# Patient Record
Sex: Male | Born: 1955
Health system: Southern US, Community
[De-identification: ages and names within clinical notes are randomized; demographics above are authoritative.]

## PROBLEM LIST (undated history)

## (undated) DIAGNOSIS — E119 Type 2 diabetes mellitus without complications: Secondary | ICD-10-CM

## (undated) DIAGNOSIS — F329 Major depressive disorder, single episode, unspecified: Secondary | ICD-10-CM

## (undated) DIAGNOSIS — B019 Varicella without complication: Secondary | ICD-10-CM

## (undated) DIAGNOSIS — I10 Essential (primary) hypertension: Secondary | ICD-10-CM

## (undated) DIAGNOSIS — F32A Depression, unspecified: Secondary | ICD-10-CM

## (undated) DIAGNOSIS — N390 Urinary tract infection, site not specified: Secondary | ICD-10-CM

## (undated) HISTORY — DX: Major depressive disorder, single episode, unspecified: F32.9

## (undated) HISTORY — DX: Varicella without complication: B01.9

## (undated) HISTORY — DX: Essential (primary) hypertension: I10

## (undated) HISTORY — DX: Depression, unspecified: F32.A

## (undated) HISTORY — DX: Type 2 diabetes mellitus without complications: E11.9

## (undated) HISTORY — DX: Urinary tract infection, site not specified: N39.0

---

## 1999-05-07 ENCOUNTER — Emergency Department (HOSPITAL_COMMUNITY): Admission: EM | Admit: 1999-05-07 | Discharge: 1999-05-07 | Payer: Self-pay

## 1999-05-08 ENCOUNTER — Encounter: Payer: Self-pay | Admitting: Ophthalmology

## 1999-05-08 ENCOUNTER — Ambulatory Visit (HOSPITAL_COMMUNITY): Admission: RE | Admit: 1999-05-08 | Discharge: 1999-05-08 | Payer: Self-pay | Admitting: Ophthalmology

## 2001-01-22 ENCOUNTER — Encounter: Payer: Self-pay | Admitting: Orthopedic Surgery

## 2001-01-22 ENCOUNTER — Ambulatory Visit (HOSPITAL_COMMUNITY): Admission: RE | Admit: 2001-01-22 | Discharge: 2001-01-22 | Payer: Self-pay | Admitting: Orthopedic Surgery

## 2003-08-15 ENCOUNTER — Emergency Department (HOSPITAL_COMMUNITY): Admission: EM | Admit: 2003-08-15 | Discharge: 2003-08-15 | Payer: Self-pay | Admitting: Emergency Medicine

## 2004-11-29 ENCOUNTER — Ambulatory Visit: Payer: Self-pay | Admitting: Internal Medicine

## 2005-01-10 ENCOUNTER — Encounter: Admission: RE | Admit: 2005-01-10 | Discharge: 2005-01-10 | Payer: Self-pay | Admitting: Orthopedic Surgery

## 2005-01-21 ENCOUNTER — Ambulatory Visit: Payer: Self-pay | Admitting: Internal Medicine

## 2005-01-31 ENCOUNTER — Ambulatory Visit: Payer: Self-pay | Admitting: Internal Medicine

## 2006-08-02 ENCOUNTER — Ambulatory Visit: Payer: Self-pay | Admitting: Cardiology

## 2006-08-16 ENCOUNTER — Ambulatory Visit: Payer: Self-pay

## 2006-08-17 ENCOUNTER — Ambulatory Visit: Payer: Self-pay

## 2006-08-23 ENCOUNTER — Ambulatory Visit: Payer: Self-pay

## 2010-08-17 NOTE — Assessment & Plan Note (Signed)
[facility]                            CARDIOLOGY OFFICE NOTE   NAME:Tyler Andrews, Tyler Andrews                    MRN:          [id number]  DATE:08/23/2006                            DOB:          1955-08-13    Tyler Andrews is a pleasant 55 year old gentleman who I recently saw on  August 02, 2006 with chest pain.  At that time, we scheduled him to have  a stress Myoview.  The patient exercised for 6 minutes and 45 seconds,  and there were no ST changes.  There was no chest pain.  His ejection  fraction was 68%.  There was diaphragmatic attenuation, but no ischemia  was noted.  He also had carotid Dopplers that showed no obstructive  disease.  Since I last saw him, he is doing reasonably well.  He did  state that he had mild chest tightness while [place] the beach, but  he has had no other symptoms.  There is no dyspnea on exertion,  orthopnea, PND, pedal edema, palpitations, presyncope, or syncope.  His  medications at present include multivitamin, Xanax 0.25 mg p.o. nightly,  CPAP, aspirin 81 mg p.o. daily, Metformin 1000 mg p.o. b.i.d., and Paxil  20 mg p.o. daily.   PHYSICAL EXAMINATION:  VITAL SIGNS:  His physical exam today shows a  blood pressure of 133/80, and his pulse is 97.  He weighs 296 pounds.  HEENT:  Normal.  NECK:  Supple.  CHEST:  Clear.  CARDIAC:  Regular rate and rhythm.  ABDOMEN:  Benign.  EXTREMITIES:  No edema.   DIAGNOSES:  1. Recent chest pain:  The patient's Myoview was low risk,      demonstrating diaphragmatic attenuation but no ischemia or      infarction.  He has normal left ventricular function.  He did have      a brief episode of chest pain while walking at the beach; however,      he has had none since then.  We have discussed the options today,      including medical therapy versus cardiac catheterization.  Given      that his Myoview is low risk, he has elected to proceed with      medical therapy, and I think this  is appropriate.  If his symptoms      worsen, then we can consider cardiac catheterization in the future,      otherwise we will plan to repeat his Myoviews in the future.  He      will continue with his aspirin, and I will add a statin as well as      an ACE inhibitor.  2. Diabetes mellitus:  He will continue on his present medications,      and this will be managed by Dr. Doristine Counter.  Again, we will add a      statin as well as an ACE inhibitor.  I will check a BMET in one      week to follow his potassium and renal function.  We will begin      lisinopril 10 mg p.o. daily.  3. Hypertension:  We have added an ACE inhibitor, both to his blood      pressure and his diabetes.  4. Obstructive sleep apnea.  5. History of anxiety/depression.  6. Risk factor modification:  We have added Zocor 40 mg p.o. nightly,      and we will check lipids      and liver in six weeks.  We will adjust with a goal LDL of less      than 70.  He will continue with diet and exercise.  He does not      smoke.  I will see him back in six months.     Madolyn Frieze Jens Som, MD, [facility]  Electronically Signed    BSC/MedQ  DD: 08/23/2006  DT: 08/23/2006  Job #: [id number]   cc:   Marjory Lies, M.D.

## 2010-08-17 NOTE — Assessment & Plan Note (Signed)
[facility]                            CARDIOLOGY OFFICE NOTE   NAME:Tyler Andrews, Tyler Andrews                    MRN:          [id number]  DATE:08/02/2006                            DOB:          08-29-1955    Tyler Andrews is a 55 year old male with a past medical history of  recently diagnosed diabetes mellitus, anxiety and depression, who  presents for evaluation of chest pain.  The patient was seen by Dr.  Gerri Spore in May of 2005 secondary to chest pain.  At that time, he  apparently was having significant social stressors.  He underwent a  Myoview on October 07, 2003.  His ejection fraction was 65%.  There was no  ischemia or infarction.  He has done well.  Over the past four months,  he has implemented an exercise program.  Approximately two weeks, he was  exercising on an elliptical trainer and after 40 minutes he developed a  pain in his left upper chest and shoulder.  It was described as an ache.  There was no nausea with this.  There was dyspnea and diaphoresis, but  the patient was exercising at the time.  After stopping, he had a  residual soreness through most of the day.  Several days later, he had a  similar sensation after 40 minutes on the treadmill.  He has limited his  activities since then and has had no further symptoms.  Note, he denies  any dyspnea on exertion, orthopnea, PND, pedal edema, palpitations,  presyncope, or syncope.  Also note, the pain was not pleuritic or  positional nor was it related to food.  Because of his chest pain, we  were asked to further evaluate.   MEDICATIONS:  1. A multivitamin daily.  2. Xanax 0.5 mg tablets, one-half p.o. q.h.s.  3. He is on CPAP.  4. Aspirin 81 mg daily.  5. Metformin 1000 mg p.o. daily.  6. Paxil 20 mg p.o. daily.   He has no known drug allergies.   SOCIAL HISTORY:  He does not smoke nor does he consume alcohol.   FAMILY HISTORY:  His father had coronary artery bypass and graft in  his  89s.   PAST MEDICAL HISTORY:  1. He has a six-month to one year history of diabetes mellitus.  2. There is no hypertension or hyperlipidemia by his report.  3. He does have obstructive sleep apnea.  4. He has had a prior kidney infection.  5. There is a history of anxiety and depression.  6. He has had no previous surgeries.   REVIEW OF SYSTEMS:  There are no headaches or fevers or chills.  There  is no productive cough or hemoptysis.  There is no dysphagia,  odynophagia, melena, or hematochezia.  There is no dysuria or hematuria.  There is no rash or seizure activity.  There is no orthopnea, PND, or  pedal edema.  The remaining systems are negative.   PHYSICAL EXAMINATION:  VITAL SIGNS:  The patient's blood pressure is  148/88 and his pulse is 96.  He weighs 292 pounds.  GENERAL:  He is  well-developed and obese.  He is in no acute distress.  His skin is warm and dry.  He does not appear to be depressed.  There is  no peripheral clubbing.  His back is normal.  HEENT:  Normal with normal eyelids.  NECK:  Supple.  There a soft right carotid bruit.  There is no left  carotid bruit.  There is no jugulovenous distention and no thyromegaly  noted.  CHEST:  Clear to auscultation with normal expansion.  CARDIOVASCULAR:  Regular rate and rhythm with normal S1 and S2.  There  are no murmurs, rubs, or gallops noted.  ABDOMEN:  Not tender, positive bowel sounds, no hepatosplenomegaly, and  no masses appreciated.  There is no abdominal bruit.  His femoral pulses  are difficult to palpate but appear to be 1+.  There is no bruit noted.  EXTREMITIES:  Mild varicosities, but there is no edema, and no cords are  palpated.  He has 2+ dorsalis pedis pulses bilaterally.  NEUROLOGIC:  Exam is grossly intact.   Electrocardiogram shows a sinus rhythm at a rate of 88.  The axis is  normal.  There are minor nonspecific ST changes.   DIAGNOSES:  1. Recent episode of chest pain - the etiology of Mr.  Sebo chest      pain is unclear.  Some of his symptoms are concerning in that they      occurred with exertion.  However, he had a residual soreness      afterwards.  He does have risk factors, and we will plan to risk      stratify with a stress Myoview.  If it shows normal perfusion then      we will continue with medical therapy.  I will continue with his      aspirin.  Note, we will follow him afterwards and if he has      recurrent symptoms with exercise then he would most likely require      a cardiac catheterization.  2. Diabetes mellitus - he will continue on his Metformin, and this      being managed by Dr. Doristine Counter.  He would benefit from a Statin and      possibly an ACE inhibitor long term, but I will leave this to Dr.      Mellody Life discretion.  3. Elevated blood pressure - this will need to be followed closely and      treated as indicated.  4. Obstructive sleep apnea.  5. History of anxiety and depression.   We will plan to see him back in two weeks to review the above-assessed  studies.  I have asked him to limit his activities until we get the  stress test performed.     Madolyn Frieze Jens Som, MD, [facility]  Electronically Signed   BSC/MedQ  DD: 08/02/2006  DT: 08/02/2006  Job #: [id number]   cc:   Marjory Lies, M.D.

## 2013-01-23 ENCOUNTER — Ambulatory Visit (INDEPENDENT_AMBULATORY_CARE_PROVIDER_SITE_OTHER): Payer: BC Managed Care – PPO | Admitting: Family Medicine

## 2013-01-23 ENCOUNTER — Encounter: Payer: Self-pay | Admitting: Family Medicine

## 2013-01-23 VITALS — BP 118/60 | HR 119 | Temp 98.4°F | Ht 65.0 in | Wt 297.9 lb

## 2013-01-23 DIAGNOSIS — E1165 Type 2 diabetes mellitus with hyperglycemia: Secondary | ICD-10-CM | POA: Insufficient documentation

## 2013-01-23 DIAGNOSIS — I10 Essential (primary) hypertension: Secondary | ICD-10-CM

## 2013-01-23 DIAGNOSIS — Z8659 Personal history of other mental and behavioral disorders: Secondary | ICD-10-CM

## 2013-01-23 DIAGNOSIS — E785 Hyperlipidemia, unspecified: Secondary | ICD-10-CM

## 2013-01-23 LAB — HM DIABETES EYE EXAM

## 2013-01-23 LAB — HM DIABETES FOOT EXAM: HM Diabetic Foot Exam: NORMAL

## 2013-01-23 NOTE — Progress Notes (Signed)
  Subjective:    Patient ID: Tyler Andrews, male    DOB: 06-Jan-1956, 57 y.o.   MRN: [id number]  HPI Patient seen to establish care. Has chronic problems including history of morbid obesity, type 2 diabetes, history of depression, hypertension, dyslipidemia. He has apparently not seen primary care and about one year. He recalls his last A1c was around 8% but this was probably over one year ago. Current medications are reviewed and as listed elsewhere. He has been very reluctant to take any kind injectable in the past. He currently takes Glucovance 5/500 mg 2 tablets twice daily. No recent hypoglycemia. Blood sugars are reviewed and consistently over 200 fasting. He has a couple postprandials also over 200.  He has eye exam scheduled for this Friday. He denies any complications of diabetes such as retinopathy, neuropathy, or nephropathy. Hyperlipidemia is treated with simvastatin. He is on lisinopril for hypertension. He takes Paxil for depression and those symptoms are stable. He has taken low dose alprazolam for sleep.  Patient nonsmoker. He works in [employer]. He is married. No alcohol use. No regular exercise. Family history significant for father with coronary disease age 4. His father had type 2 diabetes.  Past Medical History  Diagnosis Date  . Depression   . Diabetes mellitus without complication   . Hypertension   . Urinary tract infection   . Chicken pox    History reviewed. No pertinent past surgical history.  reports that he has never smoked. He does not have any smokeless tobacco history on file. He reports that he does not drink alcohol or use illicit drugs. family history includes Cancer in his maternal grandfather; Diabetes in his father; Heart disease (age of onset: 34) in his father. No Known Allergies    Review of Systems  Constitutional: Negative for appetite change, fatigue and unexpected weight change.  Respiratory: Negative for cough and shortness  of breath.   Cardiovascular: Negative for chest pain.  Gastrointestinal: Negative for nausea, vomiting and abdominal pain.  Endocrine: Negative for polydipsia and polyuria.  Genitourinary: Negative for dysuria.  Neurological: Negative for dizziness, syncope and headaches.       Objective:   Physical Exam  Constitutional:  Pleasant obese 57 year old male  HENT:  Mouth/Throat: Oropharynx is clear and moist.  Neck: Neck supple. No thyromegaly present.  Cardiovascular: Normal rate and regular rhythm.   Pulmonary/Chest: Effort normal and breath sounds normal. No respiratory distress. He has no wheezes. He has no rales.  Musculoskeletal: He exhibits no edema.  Skin:  Feet reveal no skin lesions. Good distal foot pulses. Good capillary refill. No calluses. Normal sensation with monofilament testing   Psychiatric: He has a normal mood and affect. His behavior is normal.          Assessment & Plan:  #1 type 2 diabetes. History of suboptimal control and especially by recent home readings. Schedule labs including A1c. We discussed possible referral for dietitian and he is undecided. We discussed weight loss at some length. Establish more consistent exercise. Eye exam scheduled for this Friday #2 hypertension. Blood pressure well controlled today #3 dyslipidemia on simvastatin. Schedule fasting labs #4 history of depression which is currently stable on Paxil

## 2013-01-23 NOTE — Patient Instructions (Signed)
Type 2 Diabetes Mellitus, Adult Type 2 diabetes mellitus, often simply referred to as type 2 diabetes, is a long-lasting (chronic) disease. In type 2 diabetes, the pancreas does not make enough insulin (a hormone), the cells are less responsive to the insulin that is made (insulin resistance), or both. Normally, insulin moves sugars from food into the tissue cells. The tissue cells use the sugars for energy. The lack of insulin or the lack of normal response to insulin causes excess sugars to build up in the blood instead of going into the tissue cells. As a result, high blood sugar (hyperglycemia) develops. The effect of high sugar (glucose) levels can cause many complications. Type 2 diabetes was also previously called adult-onset diabetes but it can occur at any age.  RISK FACTORS  A person is predisposed to developing type 2 diabetes if someone in the family has the disease and also has one or more of the following primary risk factors:  Overweight.  An inactive lifestyle.  A history of consistently eating high-calorie foods. Maintaining a normal weight and regular physical activity can reduce the chance of developing type 2 diabetes. SYMPTOMS  A person with type 2 diabetes may not show symptoms initially. The symptoms of type 2 diabetes appear slowly. The symptoms include:  Increased thirst (polydipsia).  Increased urination (polyuria).  Increased urination during the night (nocturia).  Weight loss. This weight loss may be rapid.  Frequent, recurring infections.  Tiredness (fatigue).  Weakness.  Vision changes, such as blurred vision.  Fruity smell to your breath.  Abdominal pain.  Nausea or vomiting.  Cuts or bruises which are slow to heal.  Tingling or numbness in the hands or feet. DIAGNOSIS Type 2 diabetes is frequently not diagnosed until complications of diabetes are present. Type 2 diabetes is diagnosed when symptoms or complications are present and when blood  glucose levels are increased. Your blood glucose level may be checked by one or more of the following blood tests:  A fasting blood glucose test. You will not be allowed to eat for at least 8 hours before a blood sample is taken.  A random blood glucose test. Your blood glucose is checked at any time of the day regardless of when you ate.  A hemoglobin A1c blood glucose test. A hemoglobin A1c test provides information about blood glucose control over the previous 3 months.  An oral glucose tolerance test (OGTT). Your blood glucose is measured after you have not eaten (fasted) for 2 hours and then after you drink a glucose-containing beverage. TREATMENT   You may need to take insulin or diabetes medicine daily to keep blood glucose levels in the desired range.  You will need to match insulin dosing with exercise and healthy food choices. The treatment goal is to maintain the before meal blood sugar (preprandial glucose) level at 70 130 mg/dL. HOME CARE INSTRUCTIONS   Have your hemoglobin A1c level checked twice a year.  Perform daily blood glucose monitoring as directed by your caregiver.  Monitor urine ketones when you are ill and as directed by your caregiver.  Take your diabetes medicine or insulin as directed by your caregiver to maintain your blood glucose levels in the desired range.  Never run out of diabetes medicine or insulin. It is needed every day.  Adjust insulin based on your intake of carbohydrates. Carbohydrates can raise blood glucose levels but need to be included in your diet. Carbohydrates provide vitamins, minerals, and fiber which are an essential part of   a healthy diet. Carbohydrates are found in fruits, vegetables, whole grains, dairy products, legumes, and foods containing added sugars.    Eat healthy foods. Alternate 3 meals with 3 snacks.  Lose weight if overweight.  Carry a medical alert card or wear your medical alert jewelry.  Carry a 15 gram  carbohydrate snack with you at all times to treat low blood glucose (hypoglycemia). Some examples of 15 gram carbohydrate snacks include:  Glucose tablets, 3 or 4   Glucose gel, 15 gram tube  Raisins, 2 tablespoons (24 grams)  Jelly beans, 6  Animal crackers, 8  Regular pop, 4 ounces (120 mL)  Gummy treats, 9  Recognize hypoglycemia. Hypoglycemia occurs with blood glucose levels of 70 mg/dL and below. The risk for hypoglycemia increases when fasting or skipping meals, during or after intense exercise, and during sleep. Hypoglycemia symptoms can include:  Tremors or shakes.  Decreased ability to concentrate.  Sweating.  Increased heart rate.  Headache.  Dry mouth.  Hunger.  Irritability.  Anxiety.  Restless sleep.  Altered speech or coordination.  Confusion.  Treat hypoglycemia promptly. If you are alert and able to safely swallow, follow the 15:15 rule:  Take 15 20 grams of rapid-acting glucose or carbohydrate. Rapid-acting options include glucose gel, glucose tablets, or 4 ounces (120 mL) of fruit juice, regular soda, or low fat milk.  Check your blood glucose level 15 minutes after taking the glucose.  Take 15 20 grams more of glucose if the repeat blood glucose level is still 70 mg/dL or below.  Eat a meal or snack within 1 hour once blood glucose levels return to normal.    Be alert to polyuria and polydipsia which are early signs of hyperglycemia. An early awareness of hyperglycemia allows for prompt treatment. Treat hyperglycemia as directed by your caregiver.  Engage in at least 150 minutes of moderate-intensity physical activity a week, spread over at least 3 days of the week or as directed by your caregiver. In addition, you should engage in resistance exercise at least 2 times a week or as directed by your caregiver.  Adjust your medicine and food intake as needed if you start a new exercise or sport.  Follow your sick day plan at any time you  are unable to eat or drink as usual.  Avoid tobacco use.  Limit alcohol intake to no more than 1 drink per day for nonpregnant women and 2 drinks per day for men. You should drink alcohol only when you are also eating food. Talk with your caregiver whether alcohol is safe for you. Tell your caregiver if you drink alcohol several times a week.  Follow up with your caregiver regularly.  Schedule an eye exam soon after the diagnosis of type 2 diabetes and then annually.  Perform daily skin and foot care. Examine your skin and feet daily for cuts, bruises, redness, nail problems, bleeding, blisters, or sores. A foot exam by a caregiver should be done annually.  Brush your teeth and gums at least twice a day and floss at least once a day. Follow up with your dentist regularly.  Share your diabetes management plan with your workplace or school.  Stay up-to-date with immunizations.  Learn to manage stress.  Obtain ongoing diabetes education and support as needed.  Participate in, or seek rehabilitation as needed to maintain or improve independence and quality of life. Request a physical or occupational therapy referral if you are having foot or hand numbness or difficulties with grooming,   dressing, eating, or physical activity. SEEK MEDICAL CARE IF:   You are unable to eat food or drink fluids for more than 6 hours.  You have nausea and vomiting for more than 6 hours.  Your blood glucose level is over 240 mg/dL.  There is a change in mental status.  You develop an additional serious illness.  You have diarrhea for more than 6 hours.  You have been sick or have had a fever for a couple of days and are not getting better.  You have pain during any physical activity.  SEEK IMMEDIATE MEDICAL CARE IF:  You have difficulty breathing.  You have moderate to large ketone levels. MAKE SURE YOU:  Understand these instructions.  Will watch your condition.  Will get help right away if  you are not doing well or get worse. Document Released: 03/21/2005 Document Revised: 12/14/2011 Document Reviewed: 10/18/2011 [employer] Patient Information 2014 [employer].  

## 2013-02-07 ENCOUNTER — Other Ambulatory Visit: Payer: BC Managed Care – PPO

## 2013-02-11 ENCOUNTER — Encounter: Payer: Self-pay | Admitting: Family Medicine

## 2013-02-11 ENCOUNTER — Other Ambulatory Visit: Payer: Self-pay

## 2013-02-11 ENCOUNTER — Ambulatory Visit (INDEPENDENT_AMBULATORY_CARE_PROVIDER_SITE_OTHER): Payer: BC Managed Care – PPO | Admitting: Family Medicine

## 2013-02-11 VITALS — BP 128/68 | HR 78 | Temp 97.8°F | Wt 299.0 lb

## 2013-02-11 DIAGNOSIS — I10 Essential (primary) hypertension: Secondary | ICD-10-CM

## 2013-02-11 DIAGNOSIS — E785 Hyperlipidemia, unspecified: Secondary | ICD-10-CM

## 2013-02-11 DIAGNOSIS — E119 Type 2 diabetes mellitus without complications: Secondary | ICD-10-CM

## 2013-02-11 DIAGNOSIS — E1165 Type 2 diabetes mellitus with hyperglycemia: Secondary | ICD-10-CM

## 2013-02-11 MED ORDER — ALPRAZOLAM 0.5 MG PO TABS: 0.5000 mg | ORAL_TABLET | Freq: Every evening | ORAL | Status: DC | PRN

## 2013-02-11 MED ORDER — GLYBURIDE-METFORMIN 5-500 MG PO TABS: 2.0000 | ORAL_TABLET | Freq: Two times a day (BID) | ORAL | Status: DC

## 2013-02-11 MED ORDER — SIMVASTATIN 40 MG PO TABS: 40.0000 mg | ORAL_TABLET | Freq: Every day | ORAL | Status: DC

## 2013-02-11 MED ORDER — PAROXETINE HCL 20 MG PO TABS: 20.0000 mg | ORAL_TABLET | ORAL | Status: DC

## 2013-02-11 MED ORDER — CANAGLIFLOZIN 100 MG PO TABS: 1.0000 | ORAL_TABLET | Freq: Every day | ORAL | Status: DC

## 2013-02-11 MED ORDER — LISINOPRIL 10 MG PO TABS: 10.0000 mg | ORAL_TABLET | Freq: Every day | ORAL | Status: DC

## 2013-02-11 NOTE — Progress Notes (Signed)
  Subjective:    Patient ID: Tyler Andrews, male    DOB: Jun 24, 1955, 57 y.o.   MRN: [id number]  HPI  Patient is seen for medical followup. History of morbid obesity, type 2 diabetes, hypertension, hyperlipidemia and history of depression. Patient had some recent lab work which is done through his employer. These labs are reviewed. HDL 44, triglycerides 147, total cholesterol 150, LDL 77. Hemoglobin A1c 9.1%.  Patient's had type 2 diabetes for at least 10 years. Currently takes Glucovance. Has not taken other medications. His A1c's have gradually climbed over the past year. Poor dietary compliance. He does get regular eye exams. No history of any retinopathy, neuropathy, or nephropathy  Hypertension treated with lisinopril and well controlled. No cough or other side effects.  Hyperlipidemia treated with simvastatin. Lipids fairly well controlled as above.  Past Medical History  Diagnosis Date  . Depression   . Diabetes mellitus without complication   . Hypertension   . Urinary tract infection   . Chicken pox    No past surgical history on file.  reports that he has never smoked. He does not have any smokeless tobacco history on file. He reports that he does not drink alcohol or use illicit drugs. family history includes Cancer in his maternal grandfather; Diabetes in his father; Heart disease (age of onset: 6) in his father. No Known Allergies   Review of Systems  Constitutional: Negative for fatigue and unexpected weight change.  Eyes: Negative for visual disturbance.  Respiratory: Negative for cough, chest tightness and shortness of breath.   Cardiovascular: Negative for chest pain, palpitations and leg swelling.  Endocrine: Negative for polydipsia and polyuria.  Genitourinary: Negative for dysuria.  Neurological: Negative for dizziness, syncope, weakness, light-headedness and headaches.       Objective:   Physical Exam  Constitutional: He appears well-developed and  well-nourished. No distress.  Neck: Neck supple.  Cardiovascular: Normal rate and regular rhythm.   Pulmonary/Chest: Effort normal and breath sounds normal. No respiratory distress. He has no wheezes. He has no rales.  Musculoskeletal: He exhibits no edema.          Assessment & Plan:  #1 type 2 diabetes. Poorly controlled. He is not interested and injectables at this time. We have talked about foundation for diabetes control being weight loss, exercise, and dietary control. Check basic metabolic panel. If GFR over 45 start Invokana 100 mg once daily. Reassess in 2 months. Samples given #2 hypertension well controlled. Continue current medication #3 hyperlipidemia with recent lipids as above. LDL at goal. #4 chronic insomnia. He is obstructive sleep apnea and for years has taken low-dose alprazolam. This is refilled for 6 months

## 2013-02-12 ENCOUNTER — Telehealth: Payer: Self-pay | Admitting: Family Medicine

## 2013-02-12 LAB — BASIC METABOLIC PANEL
BUN: 18 mg/dL (ref 6–23)
Calcium: 9.2 mg/dL (ref 8.4–10.5)
GFR: 94.36 mL/min (ref >60.00)
Glucose, Bld: 195 mg/dL — ABNORMAL HIGH (ref 70–99)
Potassium: 4.2 mEq/L (ref 3.5–5.1)
Sodium: 136 mEq/L (ref 135–145)

## 2013-02-12 MED ORDER — GLYBURIDE-METFORMIN 5-500 MG PO TABS: 2.0000 | ORAL_TABLET | Freq: Two times a day (BID) | ORAL | Status: DC

## 2013-02-12 NOTE — Telephone Encounter (Signed)
Pt was rx the wrong dose for his glyBURIDE-metformin (GLUCOVANCE) 5-500 MG per tablet. Pt take 2 tabs/ 2 x day. We sent in 180 tabs which is only 45 days worth. Need 90 day refill. [facility]

## 2013-02-12 NOTE — Telephone Encounter (Signed)
Re sent the 90 day supply

## 2013-02-13 ENCOUNTER — Other Ambulatory Visit: Payer: Self-pay

## 2013-02-13 MED ORDER — GLYBURIDE-METFORMIN 5-500 MG PO TABS: 2.0000 | ORAL_TABLET | Freq: Two times a day (BID) | ORAL | Status: DC

## 2013-02-19 ENCOUNTER — Telehealth: Payer: Self-pay

## 2013-02-19 MED ORDER — GLYBURIDE-METFORMIN 5-500 MG PO TABS: 2.0000 | ORAL_TABLET | Freq: Two times a day (BID) | ORAL | Status: DC

## 2013-02-19 NOTE — Telephone Encounter (Addendum)
Opened in error

## 2013-02-19 NOTE — Telephone Encounter (Signed)
This rx is still not correct:  pt needs 360 tabs total, which is 4/day (2 in am , 2 in pm) which is a 90 day supply. Can you pls [facility]. They are going to send the wrong thing pretty soon!  [facility] says we will need to call them in order to get this fixed.

## 2013-02-20 ENCOUNTER — Other Ambulatory Visit: Payer: Self-pay

## 2013-02-20 MED ORDER — GLYBURIDE-METFORMIN 5-500 MG PO TABS: 2.0000 | ORAL_TABLET | Freq: Two times a day (BID) | ORAL | Status: DC

## 2013-02-20 NOTE — Telephone Encounter (Signed)
Resent #[id number] with [facility] refills to [facility]

## 2013-03-25 ENCOUNTER — Telehealth: Payer: Self-pay | Admitting: Family Medicine

## 2013-03-25 NOTE — Telephone Encounter (Signed)
Patient Information:  Caller Name: Britta Mccreedy  Phone: [phone]  Patient: Tyler Andrews, Tyler Andrews  Gender: Male  DOB: Apr 28, 1955  Age: 57 Years  PCP: Evelena Peat [facility])  Office Follow Up:  Does the office need to follow up with this patient?: No  Instructions For The Office: N/A  RN Note:  Rn discussed care advice . NO appts available with Dr. Caryl Never / PCP  for 03/26/2013, and RN offered to schedule AM appt with Dr Cato Mulligan , but pt will be traveling back from [facility] and needs afternoon appt.  Appt scheduled with Dr Fabian Sharp for 4:00 PM for 03/26/2013  and advised to seek medical attention in [facility] if worsening symptoms.  Symptoms  Reason For Call & Symptoms: Today, 03/25/2013, Spouse calling  from out of town in [facility]  wondering  if pt has yeast infection and requesting rx.  His  head of penis is red, itchy and burning since  03/23/2013.  Some tiny bumps . He has been applying OTC  Antifungal cream  today  and is feeling  some better. Spouse questions if it is related to starting Innovkana in 02/2013  Reviewed Health History In EMR: Yes  Reviewed Medications In EMR: Yes  Reviewed Allergies In EMR: Yes  Reviewed Surgeries / Procedures: Yes  Date of Onset of Symptoms: 03/23/2013  Treatments Tried: Antifungal  Treatments Tried Worked: No  Guideline(s) Used:  Penis and Scrotum Symptoms  Disposition Per Guideline:   See Today or Tomorrow in Office  Reason For Disposition Reached:   ALL other penis - scrotum symptoms (Exception: painless rash < 24 hours duration)  Advice Given:  Genital Hygiene:  Keep your penis and scrotal area dry. Wear cotton underwear.  Call Back If:  Rash spreads or becomes worse  Rash lasts more than one day  Fever occurs  You become worse.  Patient Will Follow Care Advice:  YES  Appointment Scheduled:  03/26/2013 16:00:00 Appointment Scheduled Provider:  Berniece Andreas [facility])

## 2013-03-26 ENCOUNTER — Ambulatory Visit: Payer: Self-pay | Admitting: Internal Medicine

## 2013-05-23 ENCOUNTER — Telehealth: Payer: Self-pay | Admitting: Family Medicine

## 2013-05-23 NOTE — Telephone Encounter (Signed)
Pt states his Canagliflozin (INVOKANA) 100 MG TABS is not working for him and request to come in for a lab first, and then schedule appt  so that he can discuss labs and get on a new med. Ok to schedule lab first?

## 2013-05-23 NOTE — Telephone Encounter (Signed)
Pt is going to set up appt.

## 2013-05-24 ENCOUNTER — Ambulatory Visit (INDEPENDENT_AMBULATORY_CARE_PROVIDER_SITE_OTHER): Payer: BC Managed Care – PPO | Admitting: Family Medicine

## 2013-05-24 ENCOUNTER — Encounter: Payer: Self-pay | Admitting: Family Medicine

## 2013-05-24 VITALS — BP 128/64 | HR 96 | Wt 300.0 lb

## 2013-05-24 DIAGNOSIS — IMO0001 Reserved for inherently not codable concepts without codable children: Secondary | ICD-10-CM

## 2013-05-24 DIAGNOSIS — E1165 Type 2 diabetes mellitus with hyperglycemia: Secondary | ICD-10-CM

## 2013-05-24 DIAGNOSIS — I10 Essential (primary) hypertension: Secondary | ICD-10-CM

## 2013-05-24 DIAGNOSIS — E785 Hyperlipidemia, unspecified: Secondary | ICD-10-CM

## 2013-05-24 DIAGNOSIS — IMO0002 Reserved for concepts with insufficient information to code with codable children: Secondary | ICD-10-CM

## 2013-05-24 LAB — LDL CHOLESTEROL, DIRECT: Direct LDL: 74.4 mg/dL

## 2013-05-24 LAB — LIPID PANEL
CHOL/HDL RATIO: 4
Cholesterol: 137 mg/dL (ref 0–200)
HDL: 38.7 mg/dL — ABNORMAL LOW (ref >39.00)
Triglycerides: 251 mg/dL — ABNORMAL HIGH (ref 0.0–149.0)
VLDL: 50.2 mg/dL — ABNORMAL HIGH (ref 0.0–40.0)

## 2013-05-24 LAB — HEPATIC FUNCTION PANEL
ALK PHOS: 65 U/L (ref 39–117)
ALT: 27 U/L (ref 0–53)
AST: 16 U/L (ref 0–37)
Albumin: 3.7 g/dL (ref 3.5–5.2)
BILIRUBIN DIRECT: 0 mg/dL (ref 0.0–0.3)
Total Bilirubin: 0.4 mg/dL (ref 0.3–1.2)
Total Protein: 7.3 g/dL (ref 6.0–8.3)

## 2013-05-24 LAB — HEMOGLOBIN A1C: Hgb A1c MFr Bld: 9.2 % — ABNORMAL HIGH (ref 4.6–6.5)

## 2013-05-24 MED ORDER — FLUCONAZOLE 150 MG PO TABS: 150.0000 mg | ORAL_TABLET | Freq: Once | ORAL | Status: DC

## 2013-05-24 NOTE — Progress Notes (Signed)
   Subjective:    Patient ID: Tyler Andrews, male    DOB: 1955-10-09, 58 y.o.   MRN: [id number]  Diabetes Pertinent negatives for hypoglycemia include no dizziness or headaches. Pertinent negatives for diabetes include no chest pain, no fatigue, no polydipsia, no polyuria and no weakness.   Patient is seen for medical followup. History of obesity, type 2 diabetes, hypertension, hyperlipidemia, and history of recurrent depression. We started  Invokana and he developed what sounds like possible yeast infection. At this point he only takes glyburide/metformin combination. Recent fasting blood sugars around 200 and postprandial blood sugars around 300. He denies any symptoms of hyperglycemia. He has poor compliance with exercise and diet the plan is to make some changes soon. He takes simvastatin for hyperlipidemia. Lisinopril for hypertension. Blood pressure well controlled. He is getting regular eye exams. He's had some occasional fleeting neuropathy type symptoms in the feet   Past Medical History  Diagnosis Date  . Depression   . Diabetes mellitus without complication   . Hypertension   . Urinary tract infection   . Chicken pox    No past surgical history on file.  reports that he has never smoked. He does not have any smokeless tobacco history on file. He reports that he does not drink alcohol or use illicit drugs. family history includes Cancer in his maternal grandfather; Diabetes in his father; Heart disease (age of onset: 66) in his father. No Known Allergies    Review of Systems  Constitutional: Negative for fatigue.  Eyes: Negative for visual disturbance.  Respiratory: Negative for cough, chest tightness and shortness of breath.   Cardiovascular: Negative for chest pain, palpitations and leg swelling.  Endocrine: Negative for polydipsia and polyuria.  Neurological: Negative for dizziness, syncope, weakness, light-headedness and headaches.       Objective:   Physical Exam    Constitutional: He is oriented to person, place, and time. He appears well-developed and well-nourished.  HENT:  Right Ear: External ear normal.  Left Ear: External ear normal.  Mouth/Throat: Oropharynx is clear and moist.  Eyes: Pupils are equal, round, and reactive to light.  Neck: Neck supple. No thyromegaly present.  Cardiovascular: Normal rate and regular rhythm.   Pulmonary/Chest: Effort normal and breath sounds normal. No respiratory distress. He has no wheezes. He has no rales.  Musculoskeletal: He exhibits no edema.  Neurological: He is alert and oriented to person, place, and time.  Skin:  Feet no lesions. Normal distal pulses          Assessment & Plan:  #1 type 2 diabetes. History of suboptimal control. Possible side effects with Invokana . Discussed importance of weight loss. Recheck A1c. Will do one more trial with medication as above. If not tolerating at that time we discussed either addition of long-acting insulin versus possible Victoza. #2 hypertension well controlled. #3 dyslipidemia. Recheck lipid and hepatic panel

## 2013-05-24 NOTE — Progress Notes (Signed)
Pre visit review using our clinic review tool, if applicable. No additional management support is needed unless otherwise documented below in the visit note. 

## 2013-05-27 ENCOUNTER — Telehealth: Payer: Self-pay | Admitting: Family Medicine

## 2013-05-27 NOTE — Telephone Encounter (Signed)
Relevant patient education mailed to patient.  

## 2013-07-24 ENCOUNTER — Other Ambulatory Visit: Payer: Self-pay | Admitting: Family Medicine

## 2013-08-21 ENCOUNTER — Ambulatory Visit (INDEPENDENT_AMBULATORY_CARE_PROVIDER_SITE_OTHER): Payer: BC Managed Care – PPO | Admitting: Family Medicine

## 2013-08-21 ENCOUNTER — Encounter: Payer: Self-pay | Admitting: Family Medicine

## 2013-08-21 VITALS — BP 126/68 | HR 99 | Temp 98.4°F | Wt 300.0 lb

## 2013-08-21 DIAGNOSIS — I10 Essential (primary) hypertension: Secondary | ICD-10-CM

## 2013-08-21 DIAGNOSIS — E1165 Type 2 diabetes mellitus with hyperglycemia: Secondary | ICD-10-CM

## 2013-08-21 DIAGNOSIS — R109 Unspecified abdominal pain: Secondary | ICD-10-CM

## 2013-08-21 DIAGNOSIS — R10A1 Flank pain, right side: Secondary | ICD-10-CM

## 2013-08-21 DIAGNOSIS — IMO0001 Reserved for inherently not codable concepts without codable children: Secondary | ICD-10-CM

## 2013-08-21 DIAGNOSIS — IMO0002 Reserved for concepts with insufficient information to code with codable children: Secondary | ICD-10-CM

## 2013-08-21 DIAGNOSIS — F5104 Psychophysiologic insomnia: Secondary | ICD-10-CM

## 2013-08-21 DIAGNOSIS — E785 Hyperlipidemia, unspecified: Secondary | ICD-10-CM

## 2013-08-21 DIAGNOSIS — G4700 Insomnia, unspecified: Secondary | ICD-10-CM

## 2013-08-21 LAB — POCT URINALYSIS DIPSTICK
BILIRUBIN UA: NEGATIVE
Ketones, UA: NEGATIVE
Leukocytes, UA: NEGATIVE
Nitrite, UA: NEGATIVE
Protein, UA: NEGATIVE
RBC UA: NEGATIVE
Spec Grav, UA: 1.015
Urobilinogen, UA: 0.2
pH, UA: 5

## 2013-08-21 MED ORDER — PAROXETINE HCL 20 MG PO TABS: 20.0000 mg | ORAL_TABLET | ORAL | Status: DC

## 2013-08-21 MED ORDER — LISINOPRIL 10 MG PO TABS: 10.0000 mg | ORAL_TABLET | Freq: Every day | ORAL | Status: DC

## 2013-08-21 MED ORDER — CANAGLIFLOZIN 100 MG PO TABS: 100.0000 mg | ORAL_TABLET | Freq: Every day | ORAL | Status: DC

## 2013-08-21 MED ORDER — SIMVASTATIN 40 MG PO TABS: 40.0000 mg | ORAL_TABLET | Freq: Every day | ORAL | Status: DC

## 2013-08-21 MED ORDER — ALPRAZOLAM 0.5 MG PO TABS: 0.5000 mg | ORAL_TABLET | Freq: Every evening | ORAL | Status: DC | PRN

## 2013-08-21 MED ORDER — GLYBURIDE-METFORMIN 5-500 MG PO TABS: 2.0000 | ORAL_TABLET | Freq: Two times a day (BID) | ORAL | Status: DC

## 2013-08-21 NOTE — Progress Notes (Signed)
   Subjective:    Patient ID: Tyler Andrews, male    DOB: 1956-02-02, 58 y.o.   MRN: [id number]  HPI Followup regarding type 2 diabetes. History of poor control. Last A1c 9.2%. We added Invokana 100 mg once daily. No side effects. Blood sugars are still elevated off and on. He does not check his consistently. He has not lost any weight and has not improved his exercise as he planned to.  He has some occasional right flank pains. No hematuria. No burning with urination. No fevers or chills. No appetite or weight changes. No alleviating factors.  Worse with certain movements.  Requesting refill Xanax. Takes one at night and has taken this for several years. No history of misuse. No ETOH.  Hyperlipidemia. Lipids are checked last visit and stable  Past Medical History  Diagnosis Date  . Depression   . Diabetes mellitus without complication   . Hypertension   . Urinary tract infection   . Chicken pox    No past surgical history on file.  reports that he has never smoked. He does not have any smokeless tobacco history on file. He reports that he does not drink alcohol or use illicit drugs. family history includes Cancer in his maternal grandfather; Diabetes in his father; Heart disease (age of onset: 64) in his father. No Known Allergies    Review of Systems  Constitutional: Negative for fatigue.  Eyes: Negative for visual disturbance.  Respiratory: Negative for cough, chest tightness and shortness of breath.   Cardiovascular: Negative for chest pain, palpitations and leg swelling.  Endocrine: Negative for polydipsia and polyuria.  Genitourinary: Positive for flank pain. Negative for dysuria and hematuria.  Neurological: Negative for dizziness, syncope, weakness, light-headedness and headaches.       Objective:   Physical Exam  Constitutional: He appears well-developed and well-nourished.  Neck: Neck supple. No thyromegaly present.  Cardiovascular: Normal rate and regular  rhythm.   Pulmonary/Chest: Effort normal and breath sounds normal. No respiratory distress. He has no wheezes. He has no rales.  Musculoskeletal: He exhibits no edema.  Neurological: He is alert.          Assessment & Plan:  #1 type 2 diabetes. History of poor control. Lose some weight. Recheck A1c. Consider further titration of Invokana. #2 hypertension which is well-controlled #3 hyperlipidemia. Recent LDL at goal. He still has high triglycerides and low HDL #4 chronic insomnia. Refill alprazolam for 6 months #5 right flank pain. Intermittent for quite some time. Check urinalysis.

## 2013-08-21 NOTE — Progress Notes (Signed)
Pre visit review using our clinic review tool, if applicable. No additional management support is needed unless otherwise documented below in the visit note. 

## 2013-08-22 ENCOUNTER — Other Ambulatory Visit: Payer: Self-pay

## 2013-08-22 LAB — HEMOGLOBIN A1C: HEMOGLOBIN A1C: 8.5 % — AB (ref 4.6–6.5)

## 2013-08-22 MED ORDER — CANAGLIFLOZIN 300 MG PO TABS: 300.0000 mg | ORAL_TABLET | Freq: Every day | ORAL | Status: DC

## 2013-11-25 ENCOUNTER — Other Ambulatory Visit (INDEPENDENT_AMBULATORY_CARE_PROVIDER_SITE_OTHER): Payer: BC Managed Care – PPO

## 2013-11-25 DIAGNOSIS — Z0000 Encounter for general adult medical examination without abnormal findings: Secondary | ICD-10-CM

## 2013-11-25 LAB — BASIC METABOLIC PANEL
BUN: 23 mg/dL (ref 6–23)
CHLORIDE: 103 meq/L (ref 96–112)
CO2: 26 meq/L (ref 19–32)
Calcium: 9 mg/dL (ref 8.4–10.5)
Creatinine, Ser: 0.8 mg/dL (ref 0.4–1.5)
GFR: 103.55 mL/min (ref >60.00)
GLUCOSE: 173 mg/dL — AB (ref 70–99)
POTASSIUM: 4.5 meq/L (ref 3.5–5.1)
SODIUM: 139 meq/L (ref 135–145)

## 2013-11-25 LAB — CBC WITH DIFFERENTIAL/PLATELET
BASOS PCT: 0.5 % (ref 0.0–3.0)
Basophils Absolute: 0 10*3/uL (ref 0.0–0.1)
Eosinophils Absolute: 0.2 10*3/uL (ref 0.0–0.7)
Eosinophils Relative: 2.1 % (ref 0.0–5.0)
HCT: 42.4 % (ref 39.0–52.0)
Hemoglobin: 13.9 g/dL (ref 13.0–17.0)
Lymphocytes Relative: 28.5 % (ref 12.0–46.0)
Lymphs Abs: 2.3 10*3/uL (ref 0.7–4.0)
MCHC: 32.7 g/dL (ref 30.0–36.0)
MCV: 87.6 fl (ref 78.0–100.0)
MONOS PCT: 8.8 % (ref 3.0–12.0)
Monocytes Absolute: 0.7 10*3/uL (ref 0.1–1.0)
NEUTROS PCT: 60.1 % (ref 43.0–77.0)
Neutro Abs: 4.9 10*3/uL (ref 1.4–7.7)
PLATELETS: 300 10*3/uL (ref 150.0–400.0)
RBC: 4.84 Mil/uL (ref 4.22–5.81)
RDW: 14.6 % (ref 11.5–15.5)
WBC: 8.1 10*3/uL (ref 4.0–10.5)

## 2013-11-25 LAB — PSA: PSA: 0.44 ng/mL (ref 0.10–4.00)

## 2013-11-25 LAB — POCT URINALYSIS DIPSTICK
Bilirubin, UA: NEGATIVE
Blood, UA: NEGATIVE
Ketones, UA: NEGATIVE
LEUKOCYTES UA: NEGATIVE
NITRITE UA: NEGATIVE
PH UA: 5
PROTEIN UA: NEGATIVE
Spec Grav, UA: <=1.005
Urobilinogen, UA: 0.2

## 2013-11-25 LAB — HEPATIC FUNCTION PANEL
ALBUMIN: 3.5 g/dL (ref 3.5–5.2)
ALT: 20 U/L (ref 0–53)
AST: 14 U/L (ref 0–37)
Alkaline Phosphatase: 57 U/L (ref 39–117)
Bilirubin, Direct: 0.1 mg/dL (ref 0.0–0.3)
TOTAL PROTEIN: 6.7 g/dL (ref 6.0–8.3)
Total Bilirubin: 0.6 mg/dL (ref 0.2–1.2)

## 2013-11-25 LAB — MICROALBUMIN / CREATININE URINE RATIO
Creatinine,U: 46 mg/dL
Microalb Creat Ratio: 1.3 mg/g (ref 0.0–30.0)
Microalb, Ur: 0.6 mg/dL (ref 0.0–1.9)

## 2013-11-25 LAB — HEMOGLOBIN A1C: HEMOGLOBIN A1C: 8.8 % — AB (ref 4.6–6.5)

## 2013-11-25 LAB — LIPID PANEL
CHOL/HDL RATIO: 3
Cholesterol: 130 mg/dL (ref 0–200)
HDL: 39.9 mg/dL (ref >39.00)
LDL CALC: 59 mg/dL (ref 0–99)
NONHDL: 90.1
Triglycerides: 157 mg/dL — ABNORMAL HIGH (ref 0.0–149.0)
VLDL: 31.4 mg/dL (ref 0.0–40.0)

## 2013-11-25 LAB — TSH: TSH: 2.2 u[IU]/mL (ref 0.35–4.50)

## 2013-11-27 ENCOUNTER — Ambulatory Visit: Payer: BC Managed Care – PPO | Admitting: Family Medicine

## 2013-11-28 ENCOUNTER — Ambulatory Visit (INDEPENDENT_AMBULATORY_CARE_PROVIDER_SITE_OTHER): Payer: BC Managed Care – PPO | Admitting: Family Medicine

## 2013-11-28 ENCOUNTER — Encounter: Payer: Self-pay | Admitting: Family Medicine

## 2013-11-28 VITALS — BP 126/68 | HR 94 | Temp 97.6°F | Ht 65.0 in | Wt 300.0 lb

## 2013-11-28 DIAGNOSIS — IMO0001 Reserved for inherently not codable concepts without codable children: Secondary | ICD-10-CM

## 2013-11-28 DIAGNOSIS — Z23 Encounter for immunization: Secondary | ICD-10-CM

## 2013-11-28 DIAGNOSIS — E1165 Type 2 diabetes mellitus with hyperglycemia: Secondary | ICD-10-CM

## 2013-11-28 DIAGNOSIS — Z0000 Encounter for general adult medical examination without abnormal findings: Secondary | ICD-10-CM

## 2013-11-28 DIAGNOSIS — IMO0002 Reserved for concepts with insufficient information to code with codable children: Secondary | ICD-10-CM

## 2013-11-28 MED ORDER — ALPRAZOLAM 0.5 MG PO TABS: 0.5000 mg | ORAL_TABLET | Freq: Every evening | ORAL | Status: DC | PRN

## 2013-11-28 NOTE — Progress Notes (Signed)
   Subjective:    Patient ID: Tyler Andrews, male    DOB: Dec 11, 1955, 58 y.o.   MRN: [id number]  HPI Here for complete physical. He has history of obesity, type 2 diabetes, hypertension, dyslipidemia. Chronic insomnia. Medications reviewed. Blood sugars remain poorly controlled. Fasting blood sugars mostly around 160. Recent A1c 8.8%. No success with weight loss. He is already taking combination of two oral medications. Blood pressures have been well controlled. No recent chest pains.  Tetanus up-to-date. Colonoscopy 8 years ago by patient report. No history of Pneumovax. Plans to get a flu vaccine through work.  Past Medical History  Diagnosis Date  . Depression   . Diabetes mellitus without complication   . Hypertension   . Urinary tract infection   . Chicken pox    No past surgical history on file.  reports that he has never smoked. He does not have any smokeless tobacco history on file. He reports that he does not drink alcohol or use illicit drugs. family history includes Cancer in his maternal grandfather; Diabetes in his father; Heart disease (age of onset: 31) in his father. No Known Allergies    Review of Systems  Constitutional: Negative for fever, activity change, appetite change and fatigue.  HENT: Negative for congestion, ear pain and trouble swallowing.   Eyes: Negative for pain and visual disturbance.  Respiratory: Negative for cough, shortness of breath and wheezing.   Cardiovascular: Negative for chest pain and palpitations.  Gastrointestinal: Negative for nausea, vomiting, abdominal pain, diarrhea, constipation, blood in stool, abdominal distention and rectal pain.  Genitourinary: Negative for dysuria, hematuria and testicular pain.  Musculoskeletal: Negative for arthralgias and joint swelling.  Skin: Negative for rash.  Neurological: Negative for dizziness, syncope and headaches.  Hematological: Negative for adenopathy.  Psychiatric/Behavioral: Positive for  sleep disturbance. Negative for confusion and dysphoric mood.       Objective:   Physical Exam  Constitutional: He is oriented to person, place, and time. He appears well-developed and well-nourished. No distress.  HENT:  Head: Normocephalic and atraumatic.  Right Ear: External ear normal.  Left Ear: External ear normal.  Mouth/Throat: Oropharynx is clear and moist.  Eyes: Conjunctivae and EOM are normal. Pupils are equal, round, and reactive to light.  Neck: Normal range of motion. Neck supple. No thyromegaly present.  Cardiovascular: Normal rate, regular rhythm and normal heart sounds.   No murmur heard. Pulmonary/Chest: No respiratory distress. He has no wheezes. He has no rales.  Abdominal: Soft. Bowel sounds are normal. He exhibits no distension. There is no tenderness. There is no rebound and no guarding.  Umbilical hernia which is soft and nontender  Musculoskeletal: He exhibits no edema.  Lymphadenopathy:    He has no cervical adenopathy.  Neurological: He is alert and oriented to person, place, and time. He displays normal reflexes. No cranial nerve deficit.  Skin: No rash noted.  Psychiatric: He has a normal mood and affect.          Assessment & Plan:  #1 health maintenance. We have strongly advocated weight loss. Recommend Pneumovax. Recommended flu vaccine yearly. #2 hypertension well controlled #3 hyperlipidemia adequately controlled #4 type 2 diabetes poorly controlled. We discussed options. Have decided on adding long-acting basal insulin such as Levemir or Lantus 10 units once daily and titrate up 2 units every 3 days until fasting blood sugars consistently less than 130. Reassess A1c 3 months.   Pt given sample of Levemir and instructions for use.

## 2013-11-28 NOTE — Patient Instructions (Signed)
Try to lose some weight Recommend flu vaccine this fall Start Levemir insulin 10 units at night (starting tomorrow night) and titrate up 2 units every 3 days until fasting blood sugars consistently around 130 or less Left plan followup A1c and medical followup in 3 months Continue all of your other oral diabetes medications

## 2013-11-28 NOTE — Progress Notes (Signed)
Pre visit review using our clinic review tool, if applicable. No additional management support is needed unless otherwise documented below in the visit note. 

## 2013-12-02 ENCOUNTER — Telehealth: Payer: Self-pay | Admitting: Family Medicine

## 2013-12-02 MED ORDER — ALPRAZOLAM 0.5 MG PO TABS: 0.5000 mg | ORAL_TABLET | Freq: Every evening | ORAL | Status: DC | PRN

## 2013-12-02 NOTE — Telephone Encounter (Signed)
Pt is needing new rx ALPRAZolam (XANAX) 0.5 MG [facility]

## 2013-12-02 NOTE — Telephone Encounter (Signed)
Faxed RX to mail order.  

## 2013-12-17 ENCOUNTER — Telehealth: Payer: Self-pay | Admitting: Family Medicine

## 2013-12-17 NOTE — Telephone Encounter (Signed)
Patient Information:  Caller Name: Pamala Hurry  Phone: [phone]  Patient: Tyler Andrews, Tyler Andrews  Gender: Male  DOB: 1955/09/22  Age: 58 Years  PCP: Carolann Littler [facility])  Office Follow Up:  Does the office need to follow up with this patient?: Yes  Instructions For The Office: Please f/u with pt's spouse/Barbara concerning Levimer dosage, thank you.   Symptoms  Reason For Call & Symptoms: Pt's spouse/Barbara states pt started on Levimer on 11/28/13 and now taking 22 units  PM.  Pamala Hurry reports pt's FSBS continue to be in the 200's q AM.  Pt's spouse would like recommendations and what to do now.  Reviewed Health History In EMR: Yes  Reviewed Medications In EMR: Yes  Reviewed Allergies In EMR: Yes  Reviewed Surgeries / Procedures: Yes  Date of Onset of Symptoms: 11/28/2013  Guideline(s) Used:  No Protocol Available - Information Only  Disposition Per Guideline:   Discuss with PCP and Callback by Nurse Today  Reason For Disposition Reached:   Nursing judgment  Advice Given:  Call Back If:  New symptoms develop  You become worse.  Patient Will Follow Care Advice:  YES

## 2013-12-17 NOTE — Telephone Encounter (Signed)
Okay to wait on Dr Elease Hashimoto per Dr Sherren Mocha

## 2013-12-17 NOTE — Telephone Encounter (Signed)
Pt wife is calling to request samples of bd pen needles.

## 2013-12-17 NOTE — Telephone Encounter (Signed)
We had already addressed at physical- TITRATE LEVEMIR UP 2 UNITS EVERY 3 DAYS UNTIL FASTING SUGARS IN BALLPARK OF 130 (IE consistently < 130)

## 2013-12-17 NOTE — Telephone Encounter (Signed)
Pt also needs samples Insulin Detemir (LEVEMIR FLEXPEN) 100 UNIT/ML Pen

## 2013-12-18 NOTE — Telephone Encounter (Signed)
Pt is aware that samples are ready for pickup  

## 2013-12-18 NOTE — Telephone Encounter (Signed)
Pt stated that he will not eat or drink after 9pm and his sugar in the morning is 210. Pt is wondering do you want to still titrate 2 units every 3 days.

## 2013-12-18 NOTE — Telephone Encounter (Signed)
yes

## 2013-12-18 NOTE — Telephone Encounter (Signed)
Left message for patient to return call.

## 2013-12-19 NOTE — Telephone Encounter (Signed)
Pt informed

## 2014-01-07 ENCOUNTER — Telehealth: Payer: Self-pay | Admitting: Family Medicine

## 2014-01-07 NOTE — Telephone Encounter (Signed)
Pt aware that samples are ready for pick up  

## 2014-01-07 NOTE — Telephone Encounter (Signed)
Pt called to ask for samples of Levemir

## 2014-01-18 ENCOUNTER — Other Ambulatory Visit: Payer: Self-pay | Admitting: Family Medicine

## 2014-01-23 ENCOUNTER — Telehealth: Payer: Self-pay | Admitting: Family Medicine

## 2014-01-23 NOTE — Telephone Encounter (Signed)
Pt would like call back about the following med  Insulin Detemir (LEVEMIR FLEXPEN) 100 UNIT/ML Pen

## 2014-01-23 NOTE — Telephone Encounter (Signed)
Pt aware that samples are ready for pick up  

## 2014-02-05 ENCOUNTER — Telehealth: Payer: Self-pay | Admitting: Family Medicine

## 2014-02-05 NOTE — Telephone Encounter (Signed)
Pt called to ask for Insulin Detemir (LEVEMIR FLEXPEN) 100 UNIT/ML Pen   And needles

## 2014-02-05 NOTE — Telephone Encounter (Signed)
Samples are ready for pick up pt aware.

## 2014-02-17 ENCOUNTER — Telehealth: Payer: Self-pay | Admitting: Family Medicine

## 2014-02-17 NOTE — Telephone Encounter (Signed)
Pt aware that Samples are ready for pickup. Pt wants to let you know that he is up to 50 units.

## 2014-02-17 NOTE — Telephone Encounter (Signed)
Pt wife called to ask for samples of LEVEMIR.Marland Kitchen Wife said he is to 50 units and it is keeping his sugar around 145 mon

## 2014-03-03 ENCOUNTER — Ambulatory Visit: Payer: BC Managed Care – PPO | Admitting: Family Medicine

## 2014-03-03 ENCOUNTER — Encounter: Payer: Self-pay | Admitting: Family Medicine

## 2014-03-03 ENCOUNTER — Ambulatory Visit (INDEPENDENT_AMBULATORY_CARE_PROVIDER_SITE_OTHER): Payer: BC Managed Care – PPO | Admitting: Family Medicine

## 2014-03-03 VITALS — BP 130/72 | HR 98 | Temp 98.0°F | Wt 297.0 lb

## 2014-03-03 DIAGNOSIS — IMO0002 Reserved for concepts with insufficient information to code with codable children: Secondary | ICD-10-CM

## 2014-03-03 DIAGNOSIS — L918 Other hypertrophic disorders of the skin: Secondary | ICD-10-CM

## 2014-03-03 DIAGNOSIS — K439 Ventral hernia without obstruction or gangrene: Secondary | ICD-10-CM

## 2014-03-03 DIAGNOSIS — E1165 Type 2 diabetes mellitus with hyperglycemia: Secondary | ICD-10-CM

## 2014-03-03 LAB — HM DIABETES FOOT EXAM: HM Diabetic Foot Exam: NORMAL

## 2014-03-03 MED ORDER — GLYBURIDE-METFORMIN 5-500 MG PO TABS: 2.0000 | ORAL_TABLET | Freq: Two times a day (BID) | ORAL | Status: DC

## 2014-03-03 NOTE — Patient Instructions (Signed)
Keep wound dry for the first 24 hours then clean daily with soap and water for one week. Apply topical antibiotic daily for 3-4 days. Keep covered with clean dressing for 4-5 days. Follow up promptly for any signs of infection such as redness, warmth, pain, or drainage.  

## 2014-03-03 NOTE — Progress Notes (Signed)
Pre visit review using our clinic review tool, if applicable. No additional management support is needed unless otherwise documented below in the visit note. 

## 2014-03-03 NOTE — Progress Notes (Signed)
   Subjective:    Patient ID: Tyler Andrews, male    DOB: 1955/09/19, 58 y.o.   MRN: [id number]  HPI Patient seen for medical follow-up. History of poor compliance. His morbid obesity, type 2 diabetes, dyslipidemia, hypertension. He takes metformin along with glyburide combination tablet and also is on Levemir and has titrated up to 50 units once daily. Last A1c 8.8%. Very poor compliance with diet. We added Invokana within the past year but he does not think this has made much improvement in his control. He has never seen a diabetes educator and would be willing to consider this.  He has umbilical and abdominal wall hernia and is recently had occasional pain related to this. He has never seen any redness and has never had any induration. He actually has appointment already to see surgeon in early December  He has large skin tag right upper thigh which is very irritating because of location. Frequently sore. Requesting removal.  Past Medical History  Diagnosis Date  . Depression   . Diabetes mellitus without complication   . Hypertension   . Urinary tract infection   . Chicken pox    No past surgical history on file.  reports that he has never smoked. He does not have any smokeless tobacco history on file. He reports that he does not drink alcohol or use illicit drugs. family history includes Cancer in his maternal grandfather; Diabetes in his father; Heart disease (age of onset: 17) in his father. No Known Allergies    Review of Systems  Constitutional: Negative for fatigue.  Eyes: Negative for visual disturbance.  Respiratory: Negative for cough, chest tightness and shortness of breath.   Cardiovascular: Negative for chest pain, palpitations and leg swelling.  Endocrine: Negative for polydipsia and polyuria.  Neurological: Negative for dizziness, syncope, weakness, light-headedness and headaches.       Objective:   Physical Exam  Constitutional: He is oriented to person,  place, and time. He appears well-developed and well-nourished.  Cardiovascular: Normal rate and regular rhythm.   Pulmonary/Chest: Effort normal and breath sounds normal. No respiratory distress. He has no wheezes. He has no rales.  Abdominal:  He has a small umbilical hernia which is soft and nontender. Just above this he has what feels like abdominal wall hernia which is currently nontender to palpation  Musculoskeletal: He exhibits no edema.  Neurological: He is alert and oriented to person, place, and time.  Skin:  Large skin tag right upper thigh near the groin region. This has a fairly large wide base.          Assessment & Plan:  #1 type 2 diabetes with history of poor control poor and poor compliance. Set up with certified diabetes educator. Continue slow titration of Lantus until fasting blood sugars are 130. We are very likely looking at use of mealtime insulin soon.  Repeat A1C today.   #2 large skin tag right upper thigh. Discussed risks and benefits of shave excision including risks of bleeding, bruising, scar formation, and infection and patient consented to proceed.  Anesthesia with 1% xylocaine with epinephrine. Skin prepped with betadine and alcohol and shave excision of lesion with #15 blade with minimal bleeding.  Patient tolerated well.  Antibiotic and dressing  applied. Minimal bleeding controlled with silver nitrate. #3 abdominal wall hernia. Becoming more symptomatic. Patient has appointment with surgeon already-next week.  No imminent symptoms of strangulation

## 2014-03-04 LAB — HEMOGLOBIN A1C: Hgb A1c MFr Bld: 7.9 % — ABNORMAL HIGH (ref 4.6–6.5)

## 2014-03-10 ENCOUNTER — Telehealth: Payer: Self-pay | Admitting: Family Medicine

## 2014-03-10 MED ORDER — CANAGLIFLOZIN 300 MG PO TABS: 300.0000 mg | ORAL_TABLET | Freq: Every day | ORAL | Status: DC

## 2014-03-10 NOTE — Telephone Encounter (Signed)
Is it okay to change?

## 2014-03-10 NOTE — Telephone Encounter (Signed)
Patient's wife states patient wants to go back on Canagliflozin (INVOKANA) 300 MG TABS.  She said the patient felt that after being off the medication he realized it was helping.  He doesn't want to take GLUCOVANCE.   Patient can be reached at [phone]. [facility] [place], [place] - [address]

## 2014-03-10 NOTE — Telephone Encounter (Signed)
Continue with Invokana 300 mg once daily.  i would not stop his metformin.

## 2014-03-10 NOTE — Telephone Encounter (Signed)
Rx sent to pharmacy and Pt is aware 

## 2014-03-13 ENCOUNTER — Telehealth: Payer: Self-pay | Admitting: Family Medicine

## 2014-03-13 NOTE — Telephone Encounter (Signed)
Pt called and asked for Levemir samples

## 2014-03-13 NOTE — Telephone Encounter (Signed)
Pt aware that samples are ready for pick up  

## 2014-03-14 ENCOUNTER — Other Ambulatory Visit: Payer: Self-pay | Admitting: Family Medicine

## 2014-03-25 ENCOUNTER — Telehealth: Payer: Self-pay | Admitting: Family Medicine

## 2014-03-25 NOTE — Telephone Encounter (Signed)
Pt needs samples of levemir

## 2014-03-25 NOTE — Telephone Encounter (Signed)
Left message on VM that samples are ready for pickup

## 2014-04-02 ENCOUNTER — Ambulatory Visit: Payer: BC Managed Care – PPO | Admitting: Skilled Nursing Facility1

## 2014-04-11 ENCOUNTER — Telehealth: Payer: Self-pay | Admitting: Family Medicine

## 2014-04-11 NOTE — Telephone Encounter (Signed)
Pt would like samples of levemir if no samples are avail call rx into [facility] [address]

## 2014-04-11 NOTE — Telephone Encounter (Signed)
Pt aware that samples are ready for pick up  

## 2014-04-23 ENCOUNTER — Telehealth: Payer: Self-pay | Admitting: Family Medicine

## 2014-04-23 NOTE — Telephone Encounter (Signed)
Pt aware that samples are ready for pick up  

## 2014-04-23 NOTE — Telephone Encounter (Signed)
Pt wife called to ask for samples of LEVEMIR .

## 2014-05-05 ENCOUNTER — Telehealth: Payer: Self-pay | Admitting: Family Medicine

## 2014-05-05 NOTE — Telephone Encounter (Signed)
Pt request samples LEVEMIR

## 2014-05-05 NOTE — Telephone Encounter (Signed)
OK 

## 2014-05-20 ENCOUNTER — Telehealth: Payer: Self-pay | Admitting: Family Medicine

## 2014-05-20 NOTE — Telephone Encounter (Signed)
Pt informed there are no samples at this time.

## 2014-05-20 NOTE — Telephone Encounter (Signed)
Pt needs more levemir samples.

## 2014-05-26 ENCOUNTER — Telehealth: Payer: Self-pay | Admitting: Family Medicine

## 2014-05-26 ENCOUNTER — Other Ambulatory Visit: Payer: Self-pay | Admitting: Family Medicine

## 2014-05-26 NOTE — Telephone Encounter (Signed)
Patient's wife is requesting Levemir samples.

## 2014-05-26 NOTE — Telephone Encounter (Signed)
Pt informed that there are no samples in the office.

## 2014-06-02 ENCOUNTER — Ambulatory Visit: Payer: BC Managed Care – PPO | Admitting: Family Medicine

## 2014-06-04 ENCOUNTER — Telehealth: Payer: Self-pay | Admitting: Family Medicine

## 2014-06-04 MED ORDER — INSULIN DETEMIR 100 UNIT/ML FLEXPEN: 60.0000 [IU] | PEN_INJECTOR | Freq: Every day | SUBCUTANEOUS | Status: DC

## 2014-06-04 NOTE — Telephone Encounter (Signed)
Rx sent to pharmacy   

## 2014-06-04 NOTE — Telephone Encounter (Signed)
Pt request refill Insulin Detemir (LEVEMIR FLEXPEN) 100 UNIT/ML Pen Pt takes between 60 /70 units a day 30 day supply [facility]

## 2014-06-18 ENCOUNTER — Telehealth: Payer: Self-pay | Admitting: Family Medicine

## 2014-06-18 NOTE — Telephone Encounter (Signed)
Pt aware that samples are ready for pick up  

## 2014-06-18 NOTE — Telephone Encounter (Signed)
Patient's wife is requesting samples of Levemir.  Please give her a callback.

## 2014-06-30 ENCOUNTER — Ambulatory Visit: Payer: Self-pay | Admitting: Family Medicine

## 2014-07-01 ENCOUNTER — Telehealth: Payer: Self-pay | Admitting: Family Medicine

## 2014-07-01 NOTE — Telephone Encounter (Signed)
Pt aware that samples are ready for up

## 2014-07-01 NOTE — Telephone Encounter (Signed)
Pt needs levemir samples.

## 2014-07-16 ENCOUNTER — Telehealth: Payer: Self-pay | Admitting: Family Medicine

## 2014-07-16 NOTE — Telephone Encounter (Signed)
Wife called for LEVEMIR samples

## 2014-07-16 NOTE — Telephone Encounter (Signed)
Pt is aware that samples are ready for pickup

## 2014-07-18 ENCOUNTER — Ambulatory Visit (INDEPENDENT_AMBULATORY_CARE_PROVIDER_SITE_OTHER): Payer: BLUE CROSS/BLUE SHIELD | Admitting: Family Medicine

## 2014-07-18 ENCOUNTER — Encounter: Payer: Self-pay | Admitting: Family Medicine

## 2014-07-18 VITALS — BP 130/70 | HR 99 | Temp 98.4°F | Wt 295.0 lb

## 2014-07-18 DIAGNOSIS — E1165 Type 2 diabetes mellitus with hyperglycemia: Secondary | ICD-10-CM | POA: Diagnosis not present

## 2014-07-18 DIAGNOSIS — F419 Anxiety disorder, unspecified: Secondary | ICD-10-CM

## 2014-07-18 DIAGNOSIS — IMO0002 Reserved for concepts with insufficient information to code with codable children: Secondary | ICD-10-CM

## 2014-07-18 LAB — HEMOGLOBIN A1C
Hgb A1c MFr Bld: 8 % — ABNORMAL HIGH (ref <5.7)
Mean Plasma Glucose: 183 mg/dL — ABNORMAL HIGH (ref <117)

## 2014-07-18 MED ORDER — LISINOPRIL 10 MG PO TABS: 10.0000 mg | ORAL_TABLET | Freq: Every day | ORAL | Status: DC

## 2014-07-18 MED ORDER — GLYBURIDE-METFORMIN 5-500 MG PO TABS: 2.0000 | ORAL_TABLET | Freq: Two times a day (BID) | ORAL | Status: DC

## 2014-07-18 MED ORDER — ACYCLOVIR 800 MG PO TABS: 800.0000 mg | ORAL_TABLET | Freq: Two times a day (BID) | ORAL | Status: DC

## 2014-07-18 MED ORDER — SIMVASTATIN 40 MG PO TABS: 40.0000 mg | ORAL_TABLET | Freq: Every day | ORAL | Status: DC

## 2014-07-18 MED ORDER — CANAGLIFLOZIN 300 MG PO TABS: 300.0000 mg | ORAL_TABLET | Freq: Every day | ORAL | Status: DC

## 2014-07-18 MED ORDER — PAROXETINE HCL 20 MG PO TABS: 20.0000 mg | ORAL_TABLET | Freq: Every morning | ORAL | Status: DC

## 2014-07-18 NOTE — Progress Notes (Signed)
Pre visit review using our clinic review tool, if applicable. No additional management support is needed unless otherwise documented below in the visit note. 

## 2014-07-18 NOTE — Progress Notes (Signed)
   Subjective:    Patient ID: Tyler Andrews, male    DOB: 28-May-1955, 59 y.o.   MRN: [id number]  HPI Patient seen for medical follow-up. He has history of obesity, hypertension, hyperlipidemia, type 2 diabetes. History of poor compliance. He recently bought a treadmill but has not been using this. Last A1c 7.9%. We had added Invokana to his other medications which include insulin and glyburide/metformin combination pill. Does not bring log of blood sugars. No recent hypoglycemia. Denies chest pains. Other medications reviewed  Frequently feels anxious and takes low dose alprazolam and requesting increase in dose (see below).  Denies any new specific stressors.     Past Medical History  Diagnosis Date  . Depression   . Diabetes mellitus without complication   . Hypertension   . Urinary tract infection   . Chicken pox    No past surgical history on file.  reports that he has never smoked. He does not have any smokeless tobacco history on file. He reports that he does not drink alcohol or use illicit drugs. family history includes Cancer in his maternal grandfather; Diabetes in his father; Heart disease (age of onset: 57) in his father. No Known Allergies    Review of Systems  Constitutional: Negative for fatigue and unexpected weight change.  Eyes: Negative for visual disturbance.  Respiratory: Negative for cough, chest tightness and shortness of breath.   Cardiovascular: Negative for chest pain, palpitations and leg swelling.  Endocrine: Negative for polydipsia and polyuria.  Neurological: Negative for dizziness, syncope, weakness, light-headedness and headaches.       Objective:   Physical Exam  Constitutional: He is oriented to person, place, and time. He appears well-developed and well-nourished.  HENT:  Right Ear: External ear normal.  Left Ear: External ear normal.  Mouth/Throat: Oropharynx is clear and moist.  Eyes: Pupils are equal, round, and reactive to light.    Neck: Neck supple. No thyromegaly present.  Cardiovascular: Normal rate and regular rhythm.   Pulmonary/Chest: Effort normal and breath sounds normal. No respiratory distress. He has no wheezes. He has no rales.  Musculoskeletal: He exhibits no edema.  Neurological: He is alert and oriented to person, place, and time.          Assessment & Plan:  #1 type 2 diabetes. History of poor control. Needs to lose some weight. Repeat A1c. He is reluctant to consider additional medications. He is strongly advised to exercise more consistently. #2 hypertension stable #3 chronic anxiety. Patient requesting increase in alprazolam. We have recommended against this. We discussed anxiety management and continue Paxil. Consider counseling if continues to have difficulties managing stress

## 2014-07-20 DIAGNOSIS — F419 Anxiety disorder, unspecified: Secondary | ICD-10-CM | POA: Insufficient documentation

## 2014-07-21 ENCOUNTER — Other Ambulatory Visit: Payer: Self-pay | Admitting: Family Medicine

## 2014-07-21 DIAGNOSIS — E1165 Type 2 diabetes mellitus with hyperglycemia: Secondary | ICD-10-CM

## 2014-07-21 DIAGNOSIS — IMO0002 Reserved for concepts with insufficient information to code with codable children: Secondary | ICD-10-CM

## 2014-07-24 ENCOUNTER — Telehealth: Payer: Self-pay | Admitting: Family Medicine

## 2014-07-24 NOTE — Telephone Encounter (Signed)
Refill for 6 months. 

## 2014-07-24 NOTE — Telephone Encounter (Signed)
Last visit 07/18/14 Last refill 12/02/13 #90 1 refill

## 2014-07-24 NOTE — Telephone Encounter (Signed)
Pt need refill on alprazolam 0.5 mg #30 w/refills call into [facility]

## 2014-07-25 MED ORDER — ALPRAZOLAM 0.5 MG PO TABS: 0.5000 mg | ORAL_TABLET | Freq: Every evening | ORAL | Status: DC | PRN

## 2014-07-25 NOTE — Telephone Encounter (Signed)
Rx faxed to pharmacy  

## 2014-07-28 ENCOUNTER — Telehealth: Payer: Self-pay | Admitting: Family Medicine

## 2014-07-28 NOTE — Telephone Encounter (Signed)
Informed patient no samples at this time.

## 2014-07-28 NOTE — Telephone Encounter (Signed)
Pt needs levemir samples

## 2014-08-11 ENCOUNTER — Telehealth: Payer: Self-pay | Admitting: Family Medicine

## 2014-08-11 NOTE — Telephone Encounter (Signed)
Patient's wife is requesting samples of Levemir flextouch 100 units.

## 2014-08-12 NOTE — Telephone Encounter (Signed)
Pt informed. No samples at this time.

## 2014-08-19 ENCOUNTER — Telehealth: Payer: Self-pay | Admitting: Family Medicine

## 2014-08-19 NOTE — Telephone Encounter (Signed)
Pt needs more samples of levemir

## 2014-08-19 NOTE — Telephone Encounter (Signed)
No samples 

## 2014-08-20 NOTE — Telephone Encounter (Signed)
Pt wife is aware

## 2014-08-27 ENCOUNTER — Telehealth: Payer: Self-pay | Admitting: Family Medicine

## 2014-08-27 NOTE — Telephone Encounter (Signed)
Pt wife informed that we have no samples at this time.

## 2014-08-27 NOTE — Telephone Encounter (Signed)
Pt would know if you have any LEVEMIR samples

## 2014-09-02 ENCOUNTER — Telehealth: Payer: Self-pay | Admitting: Family Medicine

## 2014-09-02 NOTE — Telephone Encounter (Signed)
Patient's wife would like to know if patient can have a sample of Insulin Detemir (LEVEMIR FLEXPEN) 100 UNIT/ML Pen?

## 2014-09-02 NOTE — Telephone Encounter (Signed)
Pt wife is informed that we do not have any samples.

## 2014-09-24 ENCOUNTER — Ambulatory Visit: Payer: BLUE CROSS/BLUE SHIELD | Admitting: Family

## 2014-09-26 ENCOUNTER — Telehealth: Payer: Self-pay | Admitting: Family Medicine

## 2014-09-26 NOTE — Telephone Encounter (Signed)
Pt would ike to know if yo have any samples of Insulin Detemir (LEVEMIR FLEXPEN) 100 UNIT/ML Pen

## 2014-09-26 NOTE — Telephone Encounter (Signed)
Left message on Vm that we do not have any samples in the office at this time.

## 2014-10-08 ENCOUNTER — Telehealth: Payer: Self-pay | Admitting: Family Medicine

## 2014-10-08 NOTE — Telephone Encounter (Signed)
Pt would like to know if you have any samples of  Insulin Detemir (LEVEMIR FLEXPEN) 100 UNIT/ML Pen?

## 2014-10-08 NOTE — Telephone Encounter (Signed)
Pt aware.

## 2014-10-08 NOTE — Telephone Encounter (Signed)
No samples of Levemir

## 2014-10-21 ENCOUNTER — Telehealth: Payer: Self-pay | Admitting: Family Medicine

## 2014-10-21 NOTE — Telephone Encounter (Signed)
Wife call to ask for LEVEMIR samples.

## 2014-10-21 NOTE — Telephone Encounter (Signed)
Can you please call patient and inform them that we do not have any samples at this time.

## 2014-10-21 NOTE — Telephone Encounter (Signed)
S/w wife let her know there is no samples

## 2014-10-28 ENCOUNTER — Telehealth: Payer: Self-pay | Admitting: Family Medicine

## 2014-10-28 NOTE — Telephone Encounter (Signed)
Can you please call patient to let him know that i have samples ready for pick up.

## 2014-10-28 NOTE — Telephone Encounter (Signed)
Pt is aware sample are in

## 2014-10-28 NOTE — Telephone Encounter (Signed)
Pt needs samples of levemir

## 2014-11-07 ENCOUNTER — Telehealth: Payer: Self-pay | Admitting: Family Medicine

## 2014-11-07 NOTE — Telephone Encounter (Signed)
S/w pt

## 2014-11-07 NOTE — Telephone Encounter (Signed)
Pt req samples of LEVEMIR

## 2014-11-07 NOTE — Telephone Encounter (Signed)
Can you please call patient and let him know that I only have a vial sample of Levemir.

## 2014-11-21 ENCOUNTER — Telehealth: Payer: Self-pay | Admitting: Family Medicine

## 2014-11-21 NOTE — Telephone Encounter (Signed)
Pt want to know if we have any samples of  Insulin Detemir (LEVEMIR FLEXPEN) 100 UNIT/ML Pen

## 2014-11-21 NOTE — Telephone Encounter (Signed)
No samples 

## 2014-11-21 NOTE — Telephone Encounter (Signed)
Pt aware no samples and we are not getting many samples anymore

## 2015-01-12 ENCOUNTER — Telehealth: Payer: Self-pay | Admitting: Family Medicine

## 2015-01-12 NOTE — Telephone Encounter (Signed)
Pt would like to know if we have any levemir samples.

## 2015-01-14 NOTE — Telephone Encounter (Signed)
We don't have any samples.  

## 2015-01-16 NOTE — Telephone Encounter (Signed)
Left message on personalized voice mail no samples of levemer.

## 2015-02-03 ENCOUNTER — Telehealth: Payer: Self-pay | Admitting: Family Medicine

## 2015-02-03 NOTE — Telephone Encounter (Signed)
Pt needs levemir samples if no available pt would like a rx for novolin

## 2015-02-04 NOTE — Telephone Encounter (Signed)
I am not comfortable with changing him to a totally different insulin since I do not know him. Let's contact the pharm rep for Levemir and ask if they can bring [place] some more samples

## 2015-02-04 NOTE — Telephone Encounter (Signed)
I attempted to call the drug rep but his voicemail is full.  I sent an e-mail and will try to call back.

## 2015-02-04 NOTE — Telephone Encounter (Signed)
Patient currently on Levemir. We do not have samples here. Patient requesting rx for novolog but is not on medication list. Dr. Elease Hashimoto out of office. Could you please provide guidance.

## 2015-02-06 MED ORDER — INSULIN DEGLUDEC 100 UNIT/ML ~~LOC~~ SOPN: 70.0000 [IU] | PEN_INJECTOR | Freq: Every day | SUBCUTANEOUS | Status: DC

## 2015-02-06 NOTE — Addendum Note (Signed)
Addended by: Westley Hummer B on: 02/06/2015 10:28 AM   Modules accepted: Orders

## 2015-02-06 NOTE — Telephone Encounter (Signed)
I spoke with the drug rep and the no longer have Levemir samples.  They only sample [place].  Per Dr Sarajane Jews patient can pick up a sample for the weekend until Dr Elease Hashimoto returns.  Wife is aware and directions given.

## 2015-02-08 NOTE — Telephone Encounter (Signed)
I suggest office follow up (he is due for follow up anyway) and we can discuss insulin options if he has no coverage for Levemir.

## 2015-02-09 NOTE — Telephone Encounter (Signed)
Lm on vm to cb °

## 2015-02-09 NOTE — Telephone Encounter (Signed)
Appointment scheduled for 11/14 with Dr. Elease Hashimoto

## 2015-02-09 NOTE — Telephone Encounter (Signed)
Message sent to [facility] to make appointment

## 2015-02-16 ENCOUNTER — Ambulatory Visit: Payer: BLUE CROSS/BLUE SHIELD | Admitting: Family Medicine

## 2015-02-16 ENCOUNTER — Telehealth: Payer: Self-pay | Admitting: Family Medicine

## 2015-02-16 NOTE — Telephone Encounter (Signed)
Pt wife calling asking for samples of TRESIBA

## 2015-02-16 NOTE — Telephone Encounter (Signed)
Sample in [place] for patient. Wife is aware.

## 2015-02-24 ENCOUNTER — Telehealth: Payer: Self-pay | Admitting: Family Medicine

## 2015-02-24 NOTE — Telephone Encounter (Signed)
Yes- and if he has any medication coverage- bring in formulary book/list.

## 2015-02-24 NOTE — Telephone Encounter (Signed)
OK if available.

## 2015-02-24 NOTE — Telephone Encounter (Signed)
Pt is aware that we have samples for him. He is going to have his wife pick them up. He would like to see if we can change his diabetes medications. He is paying around 400-700 a month between the [place]. Please advise if you want him to come in to discuss this?

## 2015-02-24 NOTE — Telephone Encounter (Signed)
Pt needs to make an appt to follow-up on diabetes

## 2015-02-24 NOTE — Telephone Encounter (Signed)
Pt needs samples of treisba.

## 2015-02-25 NOTE — Telephone Encounter (Signed)
Wife is aware and will call back for a follow-up appt.

## 2015-03-09 ENCOUNTER — Telehealth: Payer: Self-pay | Admitting: Family Medicine

## 2015-03-09 ENCOUNTER — Other Ambulatory Visit: Payer: Self-pay | Admitting: Family Medicine

## 2015-03-09 NOTE — Telephone Encounter (Signed)
Pt call to ask for insulin samples Tresiba .Marland Kitchen

## 2015-03-09 NOTE — Telephone Encounter (Signed)
Samples are in the frig and pt is aware.

## 2015-03-16 ENCOUNTER — Telehealth: Payer: Self-pay | Admitting: Family Medicine

## 2015-03-16 NOTE — Telephone Encounter (Signed)
Tyler Andrews sample is in the frig to pick up. Pts wife is aware.

## 2015-03-16 NOTE — Telephone Encounter (Signed)
Pt's wife called asking if he can have some insulin samples. Please give them a phone call regarding this.  Pt's ph# [phone] Thank you.

## 2015-03-17 NOTE — Telephone Encounter (Signed)
CPE scheduled on 04/22/15

## 2015-03-24 ENCOUNTER — Telehealth: Payer: Self-pay | Admitting: Family Medicine

## 2015-03-24 NOTE — Telephone Encounter (Signed)
Pt needs more samples tresiba

## 2015-03-24 NOTE — Telephone Encounter (Signed)
Pt is aware that RX is in the frig for pick up.

## 2015-04-07 ENCOUNTER — Telehealth: Payer: Self-pay | Admitting: Family Medicine

## 2015-04-07 ENCOUNTER — Other Ambulatory Visit: Payer: Self-pay | Admitting: Family Medicine

## 2015-04-07 NOTE — Telephone Encounter (Signed)
Pt's wife called hoping to get some insulin samples. Please advise.

## 2015-04-08 NOTE — Telephone Encounter (Signed)
Pts wife is aware that we have one sample of tresiba. Sample is in refrigerator.

## 2015-04-16 ENCOUNTER — Telehealth: Payer: Self-pay | Admitting: Family Medicine

## 2015-04-16 NOTE — Telephone Encounter (Signed)
Pt needs more samples of tresiba

## 2015-04-17 ENCOUNTER — Other Ambulatory Visit: Payer: BLUE CROSS/BLUE SHIELD

## 2015-04-17 NOTE — Telephone Encounter (Signed)
Pt is aware that samples are in the frig.

## 2015-04-22 ENCOUNTER — Encounter: Payer: BLUE CROSS/BLUE SHIELD | Admitting: Family Medicine

## 2015-04-28 ENCOUNTER — Telehealth: Payer: Self-pay | Admitting: Family Medicine

## 2015-04-28 NOTE — Telephone Encounter (Signed)
Pt is aware that we have one sample for him to pick up.

## 2015-04-28 NOTE — Telephone Encounter (Signed)
Pt call to ask for samples of [place]

## 2015-05-06 ENCOUNTER — Telehealth: Payer: Self-pay | Admitting: Family Medicine

## 2015-05-06 NOTE — Telephone Encounter (Signed)
Pt is asking for samples of Insulin Degludec (TRESIBA FLEXTOUCH) 100 UNIT/ML SOPN

## 2015-05-06 NOTE — Telephone Encounter (Signed)
Sample is ready for pick up. Called and spoke with pt and pt is aware.

## 2015-05-07 ENCOUNTER — Other Ambulatory Visit (INDEPENDENT_AMBULATORY_CARE_PROVIDER_SITE_OTHER): Payer: BLUE CROSS/BLUE SHIELD

## 2015-05-07 DIAGNOSIS — E1165 Type 2 diabetes mellitus with hyperglycemia: Secondary | ICD-10-CM | POA: Diagnosis not present

## 2015-05-07 DIAGNOSIS — Z0000 Encounter for general adult medical examination without abnormal findings: Secondary | ICD-10-CM | POA: Diagnosis not present

## 2015-05-07 DIAGNOSIS — IMO0002 Reserved for concepts with insufficient information to code with codable children: Secondary | ICD-10-CM

## 2015-05-07 LAB — LIPID PANEL
Cholesterol: 151 mg/dL (ref 0–200)
HDL: 45.6 mg/dL (ref >39.00)
LDL CALC: 72 mg/dL (ref 0–99)
NONHDL: 104.9
Total CHOL/HDL Ratio: 3
Triglycerides: 165 mg/dL — ABNORMAL HIGH (ref 0.0–149.0)
VLDL: 33 mg/dL (ref 0.0–40.0)

## 2015-05-07 LAB — HEPATIC FUNCTION PANEL
ALBUMIN: 3.9 g/dL (ref 3.5–5.2)
ALT: 18 U/L (ref 0–53)
AST: 13 U/L (ref 0–37)
Alkaline Phosphatase: 60 U/L (ref 39–117)
Bilirubin, Direct: 0.1 mg/dL (ref 0.0–0.3)
Total Bilirubin: 0.4 mg/dL (ref 0.2–1.2)
Total Protein: 6.9 g/dL (ref 6.0–8.3)

## 2015-05-07 LAB — CBC WITH DIFFERENTIAL/PLATELET
BASOS ABS: 0 10*3/uL (ref 0.0–0.1)
Basophils Relative: 0.5 % (ref 0.0–3.0)
EOS PCT: 2.2 % (ref 0.0–5.0)
Eosinophils Absolute: 0.2 10*3/uL (ref 0.0–0.7)
HEMATOCRIT: 45 % (ref 39.0–52.0)
HEMOGLOBIN: 14.7 g/dL (ref 13.0–17.0)
LYMPHS ABS: 2.3 10*3/uL (ref 0.7–4.0)
LYMPHS PCT: 22.8 % (ref 12.0–46.0)
MCHC: 32.6 g/dL (ref 30.0–36.0)
MCV: 87.9 fl (ref 78.0–100.0)
MONOS PCT: 7.9 % (ref 3.0–12.0)
Monocytes Absolute: 0.8 10*3/uL (ref 0.1–1.0)
NEUTROS PCT: 66.6 % (ref 43.0–77.0)
Neutro Abs: 6.6 10*3/uL (ref 1.4–7.7)
Platelets: 310 10*3/uL (ref 150.0–400.0)
RBC: 5.11 Mil/uL (ref 4.22–5.81)
RDW: 15.2 % (ref 11.5–15.5)
WBC: 9.9 10*3/uL (ref 4.0–10.5)

## 2015-05-07 LAB — BASIC METABOLIC PANEL
BUN: 24 mg/dL — ABNORMAL HIGH (ref 6–23)
CALCIUM: 9.3 mg/dL (ref 8.4–10.5)
CO2: 28 meq/L (ref 19–32)
Chloride: 102 mEq/L (ref 96–112)
Creatinine, Ser: 0.81 mg/dL (ref 0.40–1.50)
GFR: 103.05 mL/min (ref >60.00)
Glucose, Bld: 167 mg/dL — ABNORMAL HIGH (ref 70–99)
Potassium: 4.5 mEq/L (ref 3.5–5.1)
Sodium: 139 mEq/L (ref 135–145)

## 2015-05-07 LAB — MICROALBUMIN / CREATININE URINE RATIO
CREATININE, U: 46.9 mg/dL
Microalb Creat Ratio: 1.7 mg/g (ref 0.0–30.0)
Microalb, Ur: 0.8 mg/dL (ref 0.0–1.9)

## 2015-05-07 LAB — HEMOGLOBIN A1C: HEMOGLOBIN A1C: 8.7 % — AB (ref 4.6–6.5)

## 2015-05-07 LAB — PSA: PSA: 0.53 ng/mL (ref 0.10–4.00)

## 2015-05-07 LAB — TSH: TSH: 1.99 u[IU]/mL (ref 0.35–4.50)

## 2015-05-11 ENCOUNTER — Encounter: Payer: Self-pay | Admitting: Family Medicine

## 2015-05-11 ENCOUNTER — Ambulatory Visit (INDEPENDENT_AMBULATORY_CARE_PROVIDER_SITE_OTHER): Payer: BLUE CROSS/BLUE SHIELD | Admitting: Family Medicine

## 2015-05-11 VITALS — BP 140/84 | HR 122 | Temp 98.2°F | Ht 65.0 in | Wt 292.6 lb

## 2015-05-11 DIAGNOSIS — Z0000 Encounter for general adult medical examination without abnormal findings: Secondary | ICD-10-CM | POA: Diagnosis not present

## 2015-05-11 DIAGNOSIS — G4733 Obstructive sleep apnea (adult) (pediatric): Secondary | ICD-10-CM

## 2015-05-11 MED ORDER — PAROXETINE HCL 20 MG PO TABS: 20.0000 mg | ORAL_TABLET | Freq: Every morning | ORAL | Status: DC

## 2015-05-11 MED ORDER — CANAGLIFLOZIN 300 MG PO TABS: 300.0000 mg | ORAL_TABLET | Freq: Every day | ORAL | Status: DC

## 2015-05-11 MED ORDER — ALPRAZOLAM 0.5 MG PO TABS: 0.5000 mg | ORAL_TABLET | Freq: Every evening | ORAL | Status: DC | PRN

## 2015-05-11 MED ORDER — SIMVASTATIN 40 MG PO TABS: 40.0000 mg | ORAL_TABLET | Freq: Every day | ORAL | Status: DC

## 2015-05-11 MED ORDER — INSULIN DEGLUDEC 100 UNIT/ML ~~LOC~~ SOPN: 70.0000 [IU] | PEN_INJECTOR | Freq: Every day | SUBCUTANEOUS | Status: DC

## 2015-05-11 MED ORDER — ACYCLOVIR 800 MG PO TABS: 800.0000 mg | ORAL_TABLET | Freq: Two times a day (BID) | ORAL | Status: DC

## 2015-05-11 MED ORDER — LISINOPRIL 10 MG PO TABS: 10.0000 mg | ORAL_TABLET | Freq: Every day | ORAL | Status: DC

## 2015-05-11 MED ORDER — GLYBURIDE-METFORMIN 5-500 MG PO TABS: 2.0000 | ORAL_TABLET | Freq: Two times a day (BID) | ORAL | Status: DC

## 2015-05-11 NOTE — Patient Instructions (Signed)
You will need repeat colonoscopy by September of this year Check on coverage for shingles vaccine Lose some weight Continue with yearly eye exam

## 2015-05-11 NOTE — Progress Notes (Signed)
   Subjective:    Patient ID: Tyler Andrews, male    DOB: 07-Oct-1955, 60 y.o.   MRN: [id number]  HPI Patient here for complete physical. He had colonoscopy back September 2007 with reported 10 year follow-up. No history of shingles vaccine. He had diabetic eye exam in December. Other immunizations are up-to-date.  He has long history of obesity. Inconsistent exercise and poor dietary compliance at times. He hasn't had difficulty with compliance with medications including invokana and insulin secondary to cost. He is currently back on insulin and we have provided samples.  Past Medical History  Diagnosis Date  . Depression   . Diabetes mellitus without complication ([place])   . Hypertension   . Urinary tract infection   . Chicken pox    No past surgical history on file.  reports that he has never smoked. He does not have any smokeless tobacco history on file. He reports that he does not drink alcohol or use illicit drugs. family history includes Cancer in his maternal grandfather; Diabetes in his father; Heart disease (age of onset: 69) in his father. No Known Allergies    Review of Systems  Constitutional: Negative for fever, activity change, appetite change and fatigue.  HENT: Negative for congestion, ear pain and trouble swallowing.   Eyes: Negative for pain and visual disturbance.  Respiratory: Negative for cough, shortness of breath and wheezing.   Cardiovascular: Negative for chest pain and palpitations.  Gastrointestinal: Negative for nausea, vomiting, abdominal pain, diarrhea, constipation, blood in stool, abdominal distention and rectal pain.  Genitourinary: Negative for dysuria, hematuria and testicular pain.  Musculoskeletal: Negative for myalgias, joint swelling and arthralgias.  Skin: Negative for rash.  Neurological: Negative for dizziness, syncope and headaches.  Hematological: Negative for adenopathy.  Psychiatric/Behavioral: Negative for confusion and dysphoric  mood.       Objective:   Physical Exam  Constitutional: He is oriented to person, place, and time. He appears well-developed and well-nourished. No distress.  HENT:  Head: Normocephalic and atraumatic.  Right Ear: External ear normal.  Left Ear: External ear normal.  Mouth/Throat: Oropharynx is clear and moist.  Eyes: Conjunctivae and EOM are normal. Pupils are equal, round, and reactive to light.  Neck: Normal range of motion. Neck supple. No thyromegaly present.  Cardiovascular: Normal rate, regular rhythm and normal heart sounds.   No murmur heard. Pulmonary/Chest: No respiratory distress. He has no wheezes. He has no rales.  Abdominal: Soft. Bowel sounds are normal. He exhibits no distension. There is no tenderness. There is no rebound and no guarding.  Patient has small soft non-tender umbilical hernia.  Musculoskeletal: He exhibits no edema.  Lymphadenopathy:    He has no cervical adenopathy.  Neurological: He is alert and oriented to person, place, and time. He displays normal reflexes. No cranial nerve deficit.  Skin: No rash noted.  Psychiatric: He has a normal mood and affect.          Assessment & Plan:  Physical exam.  Labs reviewed.  Diabetes poorly controlled.  Will need repeat colonoscopy by September of this year.  He will contact GI provider.  He will check on shingles coverage.  Needs to lose some weight.  Would be candidate for GLP-1 class but cost issues are making SGLT-2 and GLP-1 class prohibitive.   He is strongly encouraged to lose some weight and will follow up in 3 months.

## 2015-05-11 NOTE — Progress Notes (Signed)
Pre visit review using our clinic review tool, if applicable. No additional management support is needed unless otherwise documented below in the visit note. 

## 2015-05-19 ENCOUNTER — Telehealth: Payer: Self-pay | Admitting: Family Medicine

## 2015-05-19 NOTE — Telephone Encounter (Signed)
Pt would like samples of Insulin Degludec (TRESIBA FLEXTOUCH) 100 UNIT/ML SOPN

## 2015-05-20 NOTE — Telephone Encounter (Signed)
Pt's wife is aware that we have one sample of Tresiba in the frig.

## 2015-05-29 ENCOUNTER — Telehealth: Payer: Self-pay | Admitting: Family Medicine

## 2015-05-29 NOTE — Telephone Encounter (Signed)
Pt needs samples of tresiba

## 2015-05-29 NOTE — Telephone Encounter (Addendum)
Pt is aware via voicemail that we currently do not have samples.

## 2015-06-08 ENCOUNTER — Telehealth: Payer: Self-pay | Admitting: Family Medicine

## 2015-06-08 NOTE — Telephone Encounter (Signed)
Pt call to ask for samples of TRESIBA

## 2015-06-08 NOTE — Telephone Encounter (Signed)
Pt is aware that sample is in the refrigerator.

## 2015-06-17 ENCOUNTER — Telehealth: Payer: Self-pay | Admitting: Family Medicine

## 2015-06-17 NOTE — Telephone Encounter (Signed)
Pt needs more samples tresiba

## 2015-06-17 NOTE — Telephone Encounter (Signed)
Sample in the [place], wife is aware.

## 2015-06-25 ENCOUNTER — Telehealth: Payer: Self-pay | Admitting: Family Medicine

## 2015-06-25 NOTE — Telephone Encounter (Signed)
Pt would like samples of [place].

## 2015-06-26 NOTE — Telephone Encounter (Signed)
Wife is aware that we currently do not have any samples.

## 2015-07-01 ENCOUNTER — Telehealth: Payer: Self-pay | Admitting: Family Medicine

## 2015-07-01 NOTE — Telephone Encounter (Signed)
Pt would like samples of  Insulin Degludec (TRESIBA FLEXTOUCH) 100 UNIT/ML SOPN

## 2015-07-01 NOTE — Telephone Encounter (Signed)
Wife is aware that we currently do not have samples.

## 2015-07-08 ENCOUNTER — Telehealth: Payer: Self-pay | Admitting: Family Medicine

## 2015-07-08 NOTE — Telephone Encounter (Signed)
Pt needs more samples of tresiba

## 2015-07-08 NOTE — Telephone Encounter (Signed)
Pt is aware via voicemail that insulin sample is in the frig to pick up.

## 2015-07-21 ENCOUNTER — Telehealth: Payer: Self-pay | Admitting: Family Medicine

## 2015-07-21 NOTE — Telephone Encounter (Signed)
Pt would like samples of Insulin Degludec (TRESIBA FLEXTOUCH) 100 UNIT/ML SOPN

## 2015-07-22 NOTE — Telephone Encounter (Signed)
Pt is aware that we have a sample for him.

## 2015-07-28 ENCOUNTER — Telehealth: Payer: Self-pay | Admitting: Family Medicine

## 2015-07-28 NOTE — Telephone Encounter (Signed)
Pt asking for samples of  Insulin Degludec (TRESIBA FLEXTOUCH) 100 UNIT/ML SOPN

## 2015-07-28 NOTE — Telephone Encounter (Signed)
Pt is aware that we do have a sample.

## 2015-08-09 ENCOUNTER — Other Ambulatory Visit: Payer: Self-pay | Admitting: Family Medicine

## 2015-08-12 ENCOUNTER — Telehealth: Payer: Self-pay | Admitting: Family Medicine

## 2015-08-12 NOTE — Telephone Encounter (Signed)
Wife is aware that sample is in the frig.

## 2015-08-12 NOTE — Telephone Encounter (Signed)
Pt needs more samples of tresiba

## 2015-08-17 ENCOUNTER — Ambulatory Visit: Payer: BLUE CROSS/BLUE SHIELD | Admitting: Family Medicine

## 2015-08-24 ENCOUNTER — Telehealth: Payer: Self-pay | Admitting: Family Medicine

## 2015-08-24 NOTE — Telephone Encounter (Signed)
Will you let him know we are out of samples please. Thank you.

## 2015-08-24 NOTE — Telephone Encounter (Signed)
Pt needs more samples of tresiba

## 2015-08-25 NOTE — Telephone Encounter (Signed)
lmom that no samples of available

## 2015-09-03 ENCOUNTER — Telehealth: Payer: Self-pay | Admitting: Family Medicine

## 2015-09-03 NOTE — Telephone Encounter (Signed)
Pt is aware via voicemail that we have one sample for him.

## 2015-09-03 NOTE — Telephone Encounter (Signed)
Pt needs samples of tresiba

## 2015-09-11 ENCOUNTER — Telehealth: Payer: Self-pay | Admitting: Family Medicine

## 2015-09-11 NOTE — Telephone Encounter (Signed)
Pt is aware that we have a sample in the frig.

## 2015-09-11 NOTE — Telephone Encounter (Signed)
Pt needs samples of tresiba

## 2015-09-18 ENCOUNTER — Ambulatory Visit: Payer: BLUE CROSS/BLUE SHIELD | Admitting: Family Medicine

## 2015-09-24 ENCOUNTER — Telehealth: Payer: Self-pay | Admitting: Family Medicine

## 2015-09-24 NOTE — Telephone Encounter (Signed)
Wife is aware that there is one sample in the frig.

## 2015-09-24 NOTE — Telephone Encounter (Signed)
Pt would like to have samples of Tresiba.  Wife would like to have a call when ready.

## 2015-10-08 ENCOUNTER — Telehealth: Payer: Self-pay | Admitting: Family Medicine

## 2015-10-08 NOTE — Telephone Encounter (Signed)
Pt needs tresiba samples

## 2015-10-08 NOTE — Telephone Encounter (Signed)
Wife is aware that we have a sample for him.

## 2015-10-20 ENCOUNTER — Telehealth: Payer: Self-pay | Admitting: Family Medicine

## 2015-10-20 NOTE — Telephone Encounter (Signed)
Pt needs samples of tresiba

## 2015-10-20 NOTE — Telephone Encounter (Signed)
Pt is aware that sample is in frig for pick up.

## 2015-11-03 ENCOUNTER — Telehealth: Payer: Self-pay | Admitting: Family Medicine

## 2015-11-03 NOTE — Telephone Encounter (Signed)
Pt is aware via voicemail that we have sample for him.

## 2015-11-03 NOTE — Telephone Encounter (Signed)
° °  Pt req samples of   Insulin Degludec (TRESIBA FLEXTOUCH) 100 UNIT/ML SOPN

## 2015-11-08 ENCOUNTER — Other Ambulatory Visit: Payer: Self-pay | Admitting: Family Medicine

## 2015-11-09 NOTE — Telephone Encounter (Signed)
Refill once. Needs office follow-up-only refill #30

## 2015-11-09 NOTE — Telephone Encounter (Signed)
Pt requesting Alprazolam 0.5  Last filled 08/10/15 #90 Refills 0    Last OV 05/11/15   Okay to refill?

## 2015-11-24 ENCOUNTER — Telehealth: Payer: Self-pay | Admitting: Family Medicine

## 2015-11-24 NOTE — Telephone Encounter (Signed)
Pt needs more samples of tresiba

## 2015-11-24 NOTE — Telephone Encounter (Signed)
Pt is aware that we have a sample for him. This is in frig.

## 2015-12-08 ENCOUNTER — Telehealth: Payer: Self-pay | Admitting: Family Medicine

## 2015-12-08 NOTE — Telephone Encounter (Signed)
Pt would like samples of Insulin Degludec (TRESIBA FLEXTOUCH) 100 UNIT/ML SOPN

## 2015-12-09 NOTE — Telephone Encounter (Signed)
That sample is ready for pick up.

## 2015-12-09 NOTE — Telephone Encounter (Signed)
Tyler Andrews is going to make wife aware.

## 2015-12-09 NOTE — Telephone Encounter (Signed)
Pt is aware.  

## 2015-12-13 ENCOUNTER — Other Ambulatory Visit: Payer: Self-pay | Admitting: Family Medicine

## 2015-12-16 NOTE — Telephone Encounter (Signed)
Last refill 11/09/2015 #30 Last OV 05/11/2015 Please advise

## 2015-12-17 NOTE — Telephone Encounter (Signed)
Needs office follow up.  Refill #20 only until follow up.

## 2015-12-18 NOTE — Telephone Encounter (Signed)
I left a message for the pt to return my call. 

## 2015-12-18 NOTE — Telephone Encounter (Signed)
Patient's wife called back and I informed her of the message below.  Appt scheduled for 9/22 and she is aware the Rx was sent to his pharmacy.

## 2015-12-21 ENCOUNTER — Telehealth: Payer: Self-pay | Admitting: Family Medicine

## 2015-12-21 NOTE — Telephone Encounter (Signed)
Pt request samples Insulin Degludec (TRESIBA FLEXTOUCH) 100 UNIT/ML SOPN

## 2015-12-22 NOTE — Telephone Encounter (Signed)
Pt is aware via voicemail that insulin sample is in frig for pick up.

## 2015-12-25 ENCOUNTER — Ambulatory Visit: Payer: BLUE CROSS/BLUE SHIELD | Admitting: Family Medicine

## 2015-12-28 ENCOUNTER — Ambulatory Visit: Payer: BLUE CROSS/BLUE SHIELD | Admitting: Family Medicine

## 2015-12-29 NOTE — Telephone Encounter (Signed)
Pt has appointment tomorrow on wed 9/27 and wants to pick up samples again if we have.

## 2015-12-29 NOTE — Telephone Encounter (Signed)
Will discuss with pt at office visit.

## 2015-12-30 ENCOUNTER — Ambulatory Visit (INDEPENDENT_AMBULATORY_CARE_PROVIDER_SITE_OTHER): Payer: BLUE CROSS/BLUE SHIELD | Admitting: Family Medicine

## 2015-12-30 ENCOUNTER — Encounter: Payer: Self-pay | Admitting: Family Medicine

## 2015-12-30 VITALS — BP 118/74 | HR 106 | Temp 97.9°F | Ht 65.0 in | Wt 306.6 lb

## 2015-12-30 DIAGNOSIS — Z8659 Personal history of other mental and behavioral disorders: Secondary | ICD-10-CM | POA: Diagnosis not present

## 2015-12-30 DIAGNOSIS — I10 Essential (primary) hypertension: Secondary | ICD-10-CM | POA: Diagnosis not present

## 2015-12-30 DIAGNOSIS — F419 Anxiety disorder, unspecified: Secondary | ICD-10-CM

## 2015-12-30 DIAGNOSIS — E1165 Type 2 diabetes mellitus with hyperglycemia: Secondary | ICD-10-CM

## 2015-12-30 DIAGNOSIS — E118 Type 2 diabetes mellitus with unspecified complications: Secondary | ICD-10-CM | POA: Diagnosis not present

## 2015-12-30 DIAGNOSIS — IMO0002 Reserved for concepts with insufficient information to code with codable children: Secondary | ICD-10-CM

## 2015-12-30 DIAGNOSIS — Z794 Long term (current) use of insulin: Secondary | ICD-10-CM

## 2015-12-30 LAB — HM DIABETES FOOT EXAM: HM Diabetic Foot Exam: NORMAL

## 2015-12-30 LAB — POCT GLYCOSYLATED HEMOGLOBIN (HGB A1C): Hemoglobin A1C: 8

## 2015-12-30 LAB — HM DIABETES EYE EXAM

## 2015-12-30 MED ORDER — DULAGLUTIDE 0.75 MG/0.5ML ~~LOC~~ SOAJ: 1.0000 | SUBCUTANEOUS | 11 refills | Status: DC

## 2015-12-30 MED ORDER — METFORMIN HCL 500 MG PO TABS: ORAL_TABLET | ORAL | 3 refills | Status: DC

## 2015-12-30 MED ORDER — ALPRAZOLAM 0.5 MG PO TABS: 0.5000 mg | ORAL_TABLET | Freq: Every evening | ORAL | 0 refills | Status: DC | PRN

## 2015-12-30 NOTE — Progress Notes (Signed)
Pre visit review using our clinic review tool, if applicable. No additional management support is needed unless otherwise documented below in the visit note. 

## 2015-12-30 NOTE — Progress Notes (Signed)
Subjective:     Patient ID: Tyler Andrews, male   DOB: 03/27/1956, 60 y.o.   MRN: [id number]  HPI Patient seen for medical follow-up. He has history of morbid obesity, obstructive sleep apnea, panic disorder, chronic insomnia, dyslipidemia, recurrent depression, type 2 diabetes, hypertension.  Poor compliance with diet. He does exercise on his treadmill few times per week. Last A1c 8.7%. Recent fasting blood sugars around 175. He has unfortunately gained about 14 pounds since last visit. Current medication regimen reviewed. He is taking 70 units of long-acting insulin Tyler Aas). He remains on metformin and glyburide combination. We previously discussed possibility of GLP-1 medication. He does not think has been on that before. He is also taking invokana daily.  He has been on Paxil for many years for depression and anxiety. He has not tried any recent tapering off. We explained previously this may be contributed to his weight gain. His blood pressure has been stable. Takes simvastatin for hyperlipidemia. Lipids last winter which were stable  Plans to get flu vaccine next week through work.  Past Medical History:  Diagnosis Date  . Chicken pox   . Depression   . Diabetes mellitus without complication ([place])   . Hypertension   . Urinary tract infection    No past surgical history on file.  reports that he has never smoked. He does not have any smokeless tobacco history on file. He reports that he does not drink alcohol or use drugs. family history includes Cancer in his maternal grandfather; Diabetes in his father; Heart disease (age of onset: 77) in his father. No Known Allergies   Review of Systems  Constitutional: Negative for fatigue.  Eyes: Negative for visual disturbance.  Respiratory: Negative for cough, chest tightness and shortness of breath.   Cardiovascular: Negative for chest pain, palpitations and leg swelling.  Neurological: Negative for dizziness, syncope, weakness,  light-headedness and headaches.       Objective:   Physical Exam  Constitutional: He is oriented to person, place, and time. He appears well-developed and well-nourished.  HENT:  Right Ear: External ear normal.  Left Ear: External ear normal.  Mouth/Throat: Oropharynx is clear and moist.  Eyes: Pupils are equal, round, and reactive to light.  Neck: Neck supple. No thyromegaly present.  Cardiovascular: Normal rate and regular rhythm.   Pulmonary/Chest: Effort normal and breath sounds normal. No respiratory distress. He has no wheezes. He has no rales.  Musculoskeletal: He exhibits no edema.  Neurological: He is alert and oriented to person, place, and time.  Skin:  Feet reveal no skin lesions. Good distal foot pulses. Good capillary refill. No calluses. Normal sensation with monofilament testing        Assessment:     #1 type 2 diabetes poorly controlled with history of poor compliance  #2 morbid obesity  #3 hypertension stable and controlled  #4 history of dyslipidemia  #5 obstructive sleep apnea  #6 history of anxiety and recurrent depression stable    Plan:     -We discussed making some medication changes to try to help with weight loss including considering getting off Paxil as well as getting off glyburide especially with him being on long-acting insulin. -We'll taper Paxil at 10 mg daily for 2 weeks and try discontinuing. If he has recurrent depression or anxiety would consider more weight neutral medication such as Zoloft or Prozac -Discontinue Glucovance. Will provide new prescriptions for metformin -Start trulicity A999333 mg subcutaneous once weekly -Office follow-up in 3 months to reassess  Eulas Post MD [facility]

## 2015-12-30 NOTE — Patient Instructions (Signed)
-  Try reducing Paxil to one half tablet for 2 weeks and then discontinue -If you have any breakthrough anxiety or depression symptoms with reduction or discontinuation be in touch -Stop Glucovance -Start plain metformin 500 mg 2 tablets twice daily-new prescription has been sent in for this dosage -Start trulicity A999333 mg subcutaneous once weekly -Schedule follow-up in 3 months to reassess

## 2016-01-12 NOTE — Telephone Encounter (Signed)
Pt called again today for samples. Per autumn, no more samples.

## 2016-01-20 ENCOUNTER — Ambulatory Visit: Payer: BLUE CROSS/BLUE SHIELD | Admitting: Family Medicine

## 2016-02-01 ENCOUNTER — Ambulatory Visit (INDEPENDENT_AMBULATORY_CARE_PROVIDER_SITE_OTHER): Payer: BLUE CROSS/BLUE SHIELD | Admitting: Family Medicine

## 2016-02-01 ENCOUNTER — Encounter: Payer: Self-pay | Admitting: Family Medicine

## 2016-02-01 VITALS — BP 130/70 | HR 111 | Temp 98.3°F | Ht 65.0 in | Wt 304.2 lb

## 2016-02-01 DIAGNOSIS — M545 Low back pain, unspecified: Secondary | ICD-10-CM

## 2016-02-01 LAB — POCT URINALYSIS DIPSTICK
Bilirubin, UA: NEGATIVE
Ketones, UA: NEGATIVE
Leukocytes, UA: NEGATIVE
NITRITE UA: NEGATIVE
PROTEIN UA: NEGATIVE
RBC UA: NEGATIVE
SPEC GRAV UA: 1.015
UROBILINOGEN UA: 1
pH, UA: 7

## 2016-02-01 MED ORDER — CYCLOBENZAPRINE HCL 10 MG PO TABS: 10.0000 mg | ORAL_TABLET | Freq: Every evening | ORAL | 0 refills | Status: DC | PRN

## 2016-02-01 NOTE — Progress Notes (Signed)
Pre visit review using our clinic review tool, if applicable. No additional management support is needed unless otherwise documented below in the visit note. 

## 2016-02-01 NOTE — Patient Instructions (Signed)

## 2016-02-01 NOTE — Progress Notes (Signed)
Subjective:     Patient ID: Tyler Andrews, male   DOB: 1955-12-21, 60 y.o.   MRN: [id number]  HPI Patient seen with low back pain. Onset sometimes several months ago at least 9 months ago. Denies injury. 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 Symptoms have been somewhat intermittent. Dull achy pain occasionally sharp right lumbar region without radiation. Worse with movement. No radiculopathy symptoms. Denies lower extremity weakness or numbness. No urinary symptoms.  Has tried heat, ice, ibuprofen, topical rubs without improvement. No history of cancer. No appetite or weight changes. No fevers or chills. No hematuria. No night sweats. No abdominal pain.  Past Medical History:  Diagnosis Date  . Chicken pox   . Depression   . Diabetes mellitus without complication ([place])   . Hypertension   . Urinary tract infection    No past surgical history on file.  reports that he has never smoked. He does not have any smokeless tobacco history on file. He reports that he does not drink alcohol or use drugs. family history includes Cancer in his maternal grandfather; Diabetes in his father; Heart disease (age of onset: 4) in his father. No Known Allergies   Review of Systems  Constitutional: Negative for activity change, appetite change, fever and unexpected weight change.  Respiratory: Negative for cough and shortness of breath.   Cardiovascular: Negative for chest pain and leg swelling.  Gastrointestinal: Negative for abdominal pain and vomiting.  Genitourinary: Negative for dysuria, flank pain and hematuria.  Musculoskeletal: Positive for back pain. Negative for joint swelling.  Skin: Negative for rash.  Neurological: Negative for weakness and numbness.       Objective:   Physical Exam  Constitutional: He is oriented to person, place, and time. He appears well-developed and well-nourished. No distress.  Neck: No thyromegaly present.  Cardiovascular:  Normal rate, regular rhythm and normal heart sounds.   No murmur heard. Pulmonary/Chest: Effort normal and breath sounds normal. No respiratory distress. He has no wheezes. He has no rales.  Musculoskeletal: He exhibits no edema.  Straight leg raise are negative  Neurological: He is alert and oriented to person, place, and time. He has normal reflexes. No cranial nerve deficit.  Full strength lower extremities. Symmetric reflexes.  Skin: No rash noted.       Assessment:     Several month history of intermittent right lumbar back pain without radiculopathy symptoms. Suspect musculoskeletal. He does not have any red flags such as weight change, history of cancer, fever, etc.    Plan:     -Urinalysis today reveals 2+ glucose, o/w negative -Start with plain films lumbosacral spine, 4 views-given duration of symptoms -Consider physical therapy -Limited Flexeril 10 mg daily at bedtime when necessary muscle spasm -He is encouraged to lose some weight  Eulas Post MD [facility] at [facility]'

## 2016-02-02 ENCOUNTER — Encounter: Payer: Self-pay | Admitting: Family Medicine

## 2016-02-09 ENCOUNTER — Telehealth: Payer: Self-pay | Admitting: Family Medicine

## 2016-02-09 NOTE — Telephone Encounter (Signed)
Pt would like results of xray done last week.. Please call back.   ALSO  wants to know if we have samples of' Insulin Degludec (TRESIBA FLEXTOUCH) 100 UNIT/ML SOPN  Wife must not be aware we no longer have samples of this for pt.

## 2016-02-10 NOTE — Telephone Encounter (Signed)
I have not seen the results.  We ordered L-S spine series and they had requested getting films outside of our system- I believe [facility]. That was my concern as we have had difficulty getting timely results from some outside facilities.  Confirm where he got X-ray and will need to have results sent here.  Let them know I have not seen results.

## 2016-02-10 NOTE — Telephone Encounter (Signed)
Pt's wife was calling back about xray results--did you try calling pt?

## 2016-02-12 NOTE — Telephone Encounter (Signed)
Left detailed message for [facility] to call back and fax imaging reports.

## 2016-02-17 NOTE — Telephone Encounter (Signed)
Pt's wife is aware of results and they will call back if they are wanting to do PT.

## 2016-02-17 NOTE — Telephone Encounter (Signed)
Tried calling pt back with NA. 

## 2016-03-29 ENCOUNTER — Other Ambulatory Visit: Payer: Self-pay | Admitting: Family Medicine

## 2016-03-29 NOTE — Telephone Encounter (Signed)
Last filled 12/30/15 #90 refills 0  Last OV 02/01/16   Okay to refill?

## 2016-03-29 NOTE — Telephone Encounter (Signed)
Refill once.  Recommend he try to taper back gradually.

## 2016-04-11 ENCOUNTER — Telehealth: Payer: Self-pay

## 2016-04-11 NOTE — Telephone Encounter (Signed)
OK to switch to [employer].  Make sure pt is aware of the change.

## 2016-04-11 NOTE — Telephone Encounter (Signed)
Pharmacy refill request from [email or web address] for [place]. Insurance prefers [employer], [employer], [employer], or [employer], water. Please advise on change.

## 2016-04-12 MED ORDER — BASAGLAR KWIKPEN 100 UNIT/ML ~~LOC~~ SOPN: PEN_INJECTOR | SUBCUTANEOUS | 11 refills | Status: DC

## 2016-04-12 NOTE — Addendum Note (Signed)
Addended by: Elio Forget on: 04/12/2016 08:22 AM   Modules accepted: Orders

## 2016-04-12 NOTE — Telephone Encounter (Signed)
Tyler Aas D/C off medication list and Basiglar sent in. Tried calling pt with NA and unable to leave a voicemail.

## 2016-04-14 NOTE — Telephone Encounter (Signed)
Pt has picked up and started new medication. He will also pick up a sample of medication today that is labeled in the refrigerator.

## 2016-04-15 ENCOUNTER — Telehealth: Payer: Self-pay | Admitting: Family Medicine

## 2016-04-15 NOTE — Telephone Encounter (Signed)
error 

## 2016-05-10 ENCOUNTER — Other Ambulatory Visit: Payer: BLUE CROSS/BLUE SHIELD

## 2016-05-13 ENCOUNTER — Encounter: Payer: BLUE CROSS/BLUE SHIELD | Admitting: Family Medicine

## 2016-06-27 ENCOUNTER — Other Ambulatory Visit: Payer: Self-pay | Admitting: Family Medicine

## 2016-06-28 NOTE — Telephone Encounter (Signed)
Needs office follow up.  Refill non-controlled meds for 6 months.  Refill Alprazolam once and make sure one month follow up to discuss.

## 2016-06-28 NOTE — Telephone Encounter (Signed)
03/30/16 last refill 02/01/16 last office visit okay to fill?

## 2016-09-01 ENCOUNTER — Other Ambulatory Visit: Payer: Self-pay | Admitting: Family Medicine

## 2016-09-02 ENCOUNTER — Telehealth: Payer: Self-pay | Admitting: *Deleted

## 2016-09-02 NOTE — Telephone Encounter (Signed)
Patient is aware 

## 2016-09-02 NOTE — Telephone Encounter (Signed)
Per office note 12/30/15 a refill for Gluovance was denied.  Patient states that his glucose readings have increased 75-100 points since stopping the Glucovance.  His glucose this morning was 246.  He has tried just metformin and has increased his insuline.  Okay to refill his Glucovance?

## 2016-09-02 NOTE — Telephone Encounter (Signed)
Let him know PCP out of office. Needs appt with PCP when PCP returns to change/address longstanding uncontrolled diabetes.

## 2016-09-10 ENCOUNTER — Other Ambulatory Visit: Payer: Self-pay | Admitting: Family Medicine

## 2016-09-12 ENCOUNTER — Telehealth: Payer: Self-pay | Admitting: Family Medicine

## 2016-09-12 NOTE — Telephone Encounter (Signed)
He has not had consistent follow up for diabetes.  Not seen since October and needs office follow up.

## 2016-09-12 NOTE — Telephone Encounter (Signed)
Patient's wife called in stating the patient's Blood sugar is too high right now on just Metformin and they are asking if he can go back on the glyBURIDE-metformin (GLUCOVANCE) 5-500 MG tablet.

## 2016-09-12 NOTE — Telephone Encounter (Signed)
09/02/16 phone note has more information FYI

## 2016-09-13 NOTE — Telephone Encounter (Signed)
Appointment made

## 2016-09-21 ENCOUNTER — Encounter: Payer: Self-pay | Admitting: Family Medicine

## 2016-09-21 ENCOUNTER — Ambulatory Visit (INDEPENDENT_AMBULATORY_CARE_PROVIDER_SITE_OTHER): Payer: 59 | Admitting: Family Medicine

## 2016-09-21 VITALS — BP 110/70 | HR 110 | Temp 98.7°F | Wt 303.7 lb

## 2016-09-21 DIAGNOSIS — IMO0002 Reserved for concepts with insufficient information to code with codable children: Secondary | ICD-10-CM

## 2016-09-21 DIAGNOSIS — E118 Type 2 diabetes mellitus with unspecified complications: Secondary | ICD-10-CM | POA: Diagnosis not present

## 2016-09-21 DIAGNOSIS — F419 Anxiety disorder, unspecified: Secondary | ICD-10-CM | POA: Diagnosis not present

## 2016-09-21 DIAGNOSIS — Z794 Long term (current) use of insulin: Secondary | ICD-10-CM

## 2016-09-21 DIAGNOSIS — E785 Hyperlipidemia, unspecified: Secondary | ICD-10-CM

## 2016-09-21 DIAGNOSIS — E1165 Type 2 diabetes mellitus with hyperglycemia: Secondary | ICD-10-CM

## 2016-09-21 DIAGNOSIS — I10 Essential (primary) hypertension: Secondary | ICD-10-CM | POA: Diagnosis not present

## 2016-09-21 LAB — POCT GLYCOSYLATED HEMOGLOBIN (HGB A1C): Hemoglobin A1C: 8.5

## 2016-09-21 MED ORDER — DULAGLUTIDE 0.75 MG/0.5ML ~~LOC~~ SOAJ: 1.0000 | SUBCUTANEOUS | 11 refills | Status: DC

## 2016-09-21 MED ORDER — SERTRALINE HCL 50 MG PO TABS: 50.0000 mg | ORAL_TABLET | Freq: Every day | ORAL | 3 refills | Status: DC

## 2016-09-21 NOTE — Patient Instructions (Signed)
Let me know in next few days if Trulicity not covered.

## 2016-09-21 NOTE — Progress Notes (Signed)
Subjective:     Patient ID: Tyler Andrews, male   DOB: Dec 10, 1955, 61 y.o.   MRN: [id number]  HPI Patient seen for medical follow-up. He has history of morbid obesity, poorly controlled type 2 diabetes, obstructive sleep apnea, hyperlipidemia, hypertension and history of possible panic disorder. He was on Paxil for years but this was discontinued because of weight gain. He states he has some recent breakthrough anxiety symptoms consistent with previous panic attacks. No chest pains.  Poor compliance with diet and no consistent exercise. We had stopped his glyburide and tried to get him on Trulicity in hopes of helping his weight control but he states that he went to get prescription filled and could not afford the prescription. He did not take any coupons.  He's currently on metformin 500 milligrams 2 tablets twice daily, invokana 300 mg daily, and Basaglar insulin 70 units once daily. No recent hypoglycemia.  Past Medical History:  Diagnosis Date  . Chicken pox   . Depression   . Diabetes mellitus without complication ([place])   . Hypertension   . Urinary tract infection    No past surgical history on file.  reports that he has never smoked. He has never used smokeless tobacco. He reports that he does not drink alcohol or use drugs. family history includes Cancer in his maternal grandfather; Diabetes in his father; Heart disease (age of onset: 47) in his father. No Known Allergies   Review of Systems  Constitutional: Negative for fatigue.  Eyes: Negative for visual disturbance.  Respiratory: Negative for cough, chest tightness and shortness of breath.   Cardiovascular: Negative for chest pain, palpitations and leg swelling.  Neurological: Negative for dizziness, syncope, weakness, light-headedness and headaches.       Objective:   Physical Exam  Constitutional: He is oriented to person, place, and time. He appears well-developed and well-nourished.  HENT:  Right Ear: External  ear normal.  Left Ear: External ear normal.  Mouth/Throat: Oropharynx is clear and moist.  Eyes: Pupils are equal, round, and reactive to light.  Neck: Neck supple. No thyromegaly present.  Cardiovascular: Normal rate and regular rhythm.   Pulmonary/Chest: Effort normal and breath sounds normal. No respiratory distress. He has no wheezes. He has no rales.  Musculoskeletal: He exhibits no edema.  Neurological: He is alert and oriented to person, place, and time.       Assessment:     #1 type 2 diabetes poorly controlled with history of poor compliance. A1c today 8.5%  #2 hypertension stable and at goal  #3 dyslipidemia  #4 chronic insomnia.  #5 history of chronic anxiety with possible panic disorder      Plan:     -Start back sertraline 50 mg once daily -Strongly encouraged to lose weight -Wrote prescription for trulicity with coupon given -If we cannot get this covered will consider going back to glyburide -Need to get follow-up lipids but he is not fasting today. We will plan to get this in 3 months at follow-up. -We offered follow up with registered dietitian but he declines at this time  Eulas Post MD [facility]

## 2016-09-27 ENCOUNTER — Telehealth: Payer: Self-pay | Admitting: Family Medicine

## 2016-09-27 MED ORDER — GLIMEPIRIDE 4 MG PO TABS: 4.0000 mg | ORAL_TABLET | Freq: Every day | ORAL | 3 refills | Status: DC

## 2016-09-27 NOTE — Telephone Encounter (Signed)
D/c Trulicity. Start Amaryl 4 mg po qd (we discussed this at his office visit) and make sure he has 3 month follow up I am reluctant to start back Paxil because of this weight gain.

## 2016-09-27 NOTE — Telephone Encounter (Signed)
Pt was calling stating that the TRULICITY is too expensive and he does not like the side effects of the medication and would like to go back on the medication he was on prior to go on trulicity.  And pt would also like to change the ZOLOFT back to prior medication.  Pt would like to have a call back to discuss it in detail if there is any questions as to why the changes are requested.

## 2016-09-27 NOTE — Telephone Encounter (Signed)
Spoke with wife per DPR.  New Rx sent and med list updated.

## 2016-12-22 ENCOUNTER — Encounter: Payer: Self-pay | Admitting: Family Medicine

## 2016-12-26 ENCOUNTER — Ambulatory Visit: Payer: 59 | Admitting: Family Medicine

## 2016-12-27 ENCOUNTER — Ambulatory Visit: Payer: 59 | Admitting: Family Medicine

## 2016-12-28 ENCOUNTER — Other Ambulatory Visit: Payer: Self-pay | Admitting: Family Medicine

## 2017-01-11 ENCOUNTER — Encounter: Payer: 59 | Admitting: Family Medicine

## 2017-01-15 ENCOUNTER — Other Ambulatory Visit: Payer: Self-pay | Admitting: Family Medicine

## 2017-02-06 ENCOUNTER — Encounter: Payer: 59 | Admitting: Family Medicine

## 2017-02-22 ENCOUNTER — Encounter: Payer: Self-pay | Admitting: Family Medicine

## 2017-02-22 ENCOUNTER — Ambulatory Visit (INDEPENDENT_AMBULATORY_CARE_PROVIDER_SITE_OTHER): Payer: 59 | Admitting: Family Medicine

## 2017-02-22 ENCOUNTER — Encounter: Payer: 59 | Admitting: Family Medicine

## 2017-02-22 VITALS — BP 110/70 | HR 114 | Temp 98.5°F | Wt 306.2 lb

## 2017-02-22 DIAGNOSIS — I10 Essential (primary) hypertension: Secondary | ICD-10-CM | POA: Diagnosis not present

## 2017-02-22 DIAGNOSIS — E785 Hyperlipidemia, unspecified: Secondary | ICD-10-CM | POA: Diagnosis not present

## 2017-02-22 DIAGNOSIS — E11649 Type 2 diabetes mellitus with hypoglycemia without coma: Secondary | ICD-10-CM | POA: Diagnosis not present

## 2017-02-22 DIAGNOSIS — Z23 Encounter for immunization: Secondary | ICD-10-CM | POA: Diagnosis not present

## 2017-02-22 LAB — POCT GLYCOSYLATED HEMOGLOBIN (HGB A1C): HEMOGLOBIN A1C: 8.2

## 2017-02-22 NOTE — Progress Notes (Signed)
Subjective:     Patient ID: Tyler Andrews, male   DOB: [birth year], 61 y.o.   MRN: [id number]  HPI Patient seen for medical follow-up. His chronic problems include history of morbid obesity, hypertension, OSA, type 2 diabetes, chronic insomnia, hyperlipidemia.   Long history of poor compliance. Is on multiple diabetic medications. We've been unable to get him to lose weight and he still eats a lot of high carb choices. Not monitoring blood sugars regularly. No recent hypoglycemic symptoms. Last A1c 8.5%. We had recommended trial of GLP-1 medication but apparently cost was prohibitive. Needs diabetic eye exam and is overdue for that  He has history of obstructive sleep apnea and for years has taken Xanax especially for nighttime anxiety symptoms. We recommended trying to get off this. He's been taking Zoloft recently but does not feel this helps at all.  Denies any depression symptoms.  Needs tetanus booster.  Past Medical History:  Diagnosis Date  . Chicken pox   . Depression   . Diabetes mellitus without complication ([place])   . Hypertension   . Urinary tract infection    No past surgical history on file.  reports that  has never smoked. he has never used smokeless tobacco. He reports that he does not drink alcohol or use drugs. family history includes Cancer in his maternal grandfather; Diabetes in his father; Heart disease (age of onset: 26) in his father. No Known Allergies   Review of Systems  Constitutional: Negative for fatigue.  Eyes: Negative for visual disturbance.  Respiratory: Negative for cough, chest tightness and shortness of breath.   Cardiovascular: Negative for chest pain, palpitations and leg swelling.  Endocrine: Negative for polydipsia and polyuria.  Neurological: Negative for dizziness, syncope, weakness, light-headedness and headaches.       Objective:   Physical Exam  Constitutional: He is oriented to person, place, and time. He appears well-developed and  well-nourished.  HENT:  Right Ear: External ear normal.  Left Ear: External ear normal.  Mouth/Throat: Oropharynx is clear and moist.  Eyes: Pupils are equal, round, and reactive to light.  Neck: Neck supple. No thyromegaly present.  Cardiovascular: Normal rate and regular rhythm.  Pulmonary/Chest: Effort normal and breath sounds normal. No respiratory distress. He has no wheezes. He has no rales.  Musculoskeletal: He exhibits no edema.  Neurological: He is alert and oriented to person, place, and time.  Skin:  Feet reveal no skin lesions. Good distal foot pulses. Good capillary refill. No calluses. Normal sensation with monofilament testing        Assessment:     #1 history of morbid obesity  #2 type 2 diabetes slightly improved but suboptimal controlled A1c 8.2%  #3 dyslipidemia  #4 hypertension stable at goal    Plan:     - set up diabetic eye exam  -strongly encouraged to lose some weight  -Tetanus booster given  -Reassess in 3 months and if not closer to goal that point consider mealtime insulin  -D/C Zoloft as no clear benefit.    Eulas Post MD [facility] at [facility]

## 2017-02-22 NOTE — Patient Instructions (Signed)
Lose some weight Scale back carbs.

## 2017-02-23 LAB — HEPATIC FUNCTION PANEL
AG RATIO: 1.4 (calc) (ref 1.0–2.5)
ALBUMIN MSPROF: 4.1 g/dL (ref 3.6–5.1)
ALKALINE PHOSPHATASE (APISO): 68 U/L (ref 40–115)
ALT: 18 U/L (ref 9–46)
AST: 13 U/L (ref 10–35)
BILIRUBIN TOTAL: 0.3 mg/dL (ref 0.2–1.2)
Bilirubin, Direct: 0.1 mg/dL (ref 0.0–0.2)
GLOBULIN: 2.9 g/dL (ref 1.9–3.7)
Indirect Bilirubin: 0.2 mg/dL (calc) (ref 0.2–1.2)
TOTAL PROTEIN: 7 g/dL (ref 6.1–8.1)

## 2017-02-23 LAB — BASIC METABOLIC PANEL
BUN / CREAT RATIO: 30 (calc) — AB (ref 6–22)
BUN: 27 mg/dL — AB (ref 7–25)
CHLORIDE: 103 mmol/L (ref 98–110)
CO2: 24 mmol/L (ref 20–32)
CREATININE: 0.9 mg/dL (ref 0.70–1.25)
Calcium: 9.5 mg/dL (ref 8.6–10.3)
GLUCOSE: 130 mg/dL — AB (ref 65–99)
Potassium: 4.4 mmol/L (ref 3.5–5.3)
Sodium: 138 mmol/L (ref 135–146)

## 2017-02-23 LAB — LIPID PANEL
Cholesterol: 158 mg/dL (ref <200)
HDL: 46 mg/dL (ref >40)
LDL CHOLESTEROL (CALC): 85 mg/dL
NON-HDL CHOLESTEROL (CALC): 112 mg/dL (ref <130)
TRIGLYCERIDES: 161 mg/dL — AB (ref <150)
Total CHOL/HDL Ratio: 3.4 (calc) (ref <5.0)

## 2017-02-24 ENCOUNTER — Encounter: Payer: Self-pay | Admitting: Family Medicine

## 2017-02-27 ENCOUNTER — Other Ambulatory Visit: Payer: Self-pay | Admitting: Family Medicine

## 2017-03-29 ENCOUNTER — Other Ambulatory Visit: Payer: Self-pay | Admitting: Family Medicine

## 2017-05-08 ENCOUNTER — Other Ambulatory Visit: Payer: Self-pay | Admitting: Family Medicine

## 2017-06-04 ENCOUNTER — Other Ambulatory Visit: Payer: Self-pay | Admitting: Family Medicine

## 2017-09-11 ENCOUNTER — Other Ambulatory Visit: Payer: Self-pay | Admitting: *Deleted

## 2017-09-11 MED ORDER — BASAGLAR KWIKPEN 100 UNIT/ML ~~LOC~~ SOPN: PEN_INJECTOR | SUBCUTANEOUS | 11 refills | Status: DC

## 2017-09-20 ENCOUNTER — Other Ambulatory Visit: Payer: Self-pay | Admitting: *Deleted

## 2017-09-20 MED ORDER — BASAGLAR KWIKPEN 100 UNIT/ML ~~LOC~~ SOPN: 70.0000 [IU] | PEN_INJECTOR | Freq: Every day | SUBCUTANEOUS | 1 refills | Status: DC

## 2017-10-02 HISTORY — PX: KNEE SURGERY: SHX244

## 2017-10-20 ENCOUNTER — Ambulatory Visit: Payer: Self-pay | Admitting: *Deleted

## 2017-10-20 ENCOUNTER — Ambulatory Visit: Payer: 59 | Admitting: Family Medicine

## 2017-10-20 DIAGNOSIS — R200 Anesthesia of skin: Secondary | ICD-10-CM | POA: Diagnosis not present

## 2017-10-20 DIAGNOSIS — R42 Dizziness and giddiness: Secondary | ICD-10-CM | POA: Diagnosis not present

## 2017-10-20 NOTE — Telephone Encounter (Signed)
Wife is calling to report that her husband is having tingling in L side of face and feeling like he is floating. He has been having dizziness lately. [phone]- patient contact number  Call to patient- patient is concerned about some dizziness he has been experiencing. Patient has been moving and doing a lot of work in the heat. He is concerned has dizziness that comes and goes and he can not link it to anything he is doing or pattern it. Appointment made for evaluation.  Reason for Disposition . [1] MODERATE dizziness (e.g., interferes with normal activities) AND [2] has NOT been evaluated by physician for this  (Exception: dizziness caused by heat exposure, sudden standing, or poor fluid intake)  Answer Assessment - Initial Assessment Questions 1. SYMPTOM: "What is the main symptom you are concerned about?" (e.g., weakness, numbness)     Dizziness, face- tingling this morning 2. ONSET: "When did this start?" (minutes, hours, days; while sleeping)     1 week 3. LAST NORMAL: "When was the last time you were normal (no symptoms)?"     Comes and goes 4. PATTERN "Does this come and go, or has it been constant since it started?"  "Is it present now?"     No patern 5. CARDIAC SYMPTOMS: "Have you had any of the following symptoms: chest pain, difficulty breathing, palpitations?"     no 6. NEUROLOGIC SYMPTOMS: "Have you had any of the following symptoms: headache, dizziness, vision loss, double vision, changes in speech, unsteady on your feet?"     dizziness 7. OTHER SYMPTOMS: "Do you have any other symptoms?"     Glucose numbers have been high- 200 8. PREGNANCY: "Is there any chance you are pregnant?" "When was your last menstrual period?"     n/a  Answer Assessment - Initial Assessment Questions 1. DESCRIPTION: "Describe your dizziness."     Faint feeling- woozy 2. LIGHTHEADED: "Do you feel lightheaded?" (e.g., somewhat faint, woozy, weak upon standing)     floating 3. VERTIGO: "Do you  feel like either you or the room is spinning or tilting?" (i.e. vertigo)     no 4. SEVERITY: "How bad is it?"  "Do you feel like you are going to faint?" "Can you stand and walk?"   - MILD - walking normally   - MODERATE - interferes with normal activities (e.g., work, school)    - SEVERE - unable to stand, requires support to walk, feels like passing out now.      mild 5. ONSET:  "When did the dizziness begin?"     1 week 6. AGGRAVATING FACTORS: "Does anything make it worse?" (e.g., standing, change in head position)     no 7. HEART RATE: "Can you tell me your heart rate?" "How many beats in 15 seconds?"  (Note: not all patients can do this)       No changes noted 8. CAUSE: "What do you think is causing the dizziness?"     unknown 9. RECURRENT SYMPTOM: "Have you had dizziness before?" If so, ask: "When was the last time?" "What happened that time?"     no 10. OTHER SYMPTOMS: "Do you have any other symptoms?" (e.g., fever, chest pain, vomiting, diarrhea, bleeding)       No symptoms 11. PREGNANCY: "Is there any chance you are pregnant?" "When was your last menstrual period?"       n/a  Protocols used: DIZZINESS - LIGHTHEADEDNESS-A-AH, NEUROLOGIC DEFICIT-A-AH

## 2017-10-20 NOTE — Telephone Encounter (Signed)
Pt was scheduled for SDA with Dr Elease Hashimoto today at 3:30p but this was cancelled.   Pt states that he is feeling fine right now, symptoms come and go. He does not feel that he needs to be seen today, plans to come in next week. Pt states that if Dr Elease Hashimoto wants the patient to come in today then he will.   Will send to Dr Elease Hashimoto as Juluis Rainier.

## 2017-10-20 NOTE — Telephone Encounter (Signed)
Spoke with patient, states that he is not currently having any symptoms. Appt rescheduled from Tuesday to Monday with Dr Elease Hashimoto at 345p  Pt advised that if symptoms return and worsen over weekend to seek care at urgent care of ED. Pt unable to come in today as he lives too far away to get here before closing. Will send to Dr Elease Hashimoto as Juluis Rainier

## 2017-10-20 NOTE — Telephone Encounter (Signed)
If have unilateral numbness- or especially any weakness- should be seen with no delays.

## 2017-10-21 DIAGNOSIS — E782 Mixed hyperlipidemia: Secondary | ICD-10-CM | POA: Diagnosis not present

## 2017-10-21 DIAGNOSIS — I720 Aneurysm of carotid artery: Secondary | ICD-10-CM | POA: Diagnosis not present

## 2017-10-21 DIAGNOSIS — R7309 Other abnormal glucose: Secondary | ICD-10-CM | POA: Diagnosis not present

## 2017-10-21 DIAGNOSIS — R42 Dizziness and giddiness: Secondary | ICD-10-CM | POA: Diagnosis not present

## 2017-10-21 DIAGNOSIS — I671 Cerebral aneurysm, nonruptured: Secondary | ICD-10-CM | POA: Diagnosis not present

## 2017-10-21 MED ORDER — ASPIRIN EC 81 MG PO TBEC
81.00 | DELAYED_RELEASE_TABLET | ORAL | Status: DC
Start: 2017-10-22 — End: 2017-10-21

## 2017-10-21 MED ORDER — ACETAMINOPHEN 325 MG PO TABS
650.00 | ORAL_TABLET | ORAL | Status: DC
Start: ? — End: 2017-10-21

## 2017-10-23 ENCOUNTER — Ambulatory Visit: Payer: 59 | Admitting: Family Medicine

## 2017-10-23 ENCOUNTER — Telehealth: Payer: Self-pay | Admitting: Family Medicine

## 2017-10-23 NOTE — Telephone Encounter (Signed)
Copied from [place] [id number]. Topic: Quick Communication - Rx Refill/Question >> Oct 23, 2017  2:47 PM Jarold Motto, Fraser Din wrote: Medication: simvastatin (ZOCOR) 40 MG tablet [[id number]]   Has the patient contacted their pharmacy? yes Preferred Pharmacy (with phone number or street name):[place] [place] (Phone) [phone] (Fax)  Agent: Please be advised that RX refills may take up to 3 business days. We ask that you follow-up with your pharmacy.

## 2017-10-24 ENCOUNTER — Ambulatory Visit: Payer: 59 | Admitting: Family Medicine

## 2017-10-24 MED ORDER — SIMVASTATIN 40 MG PO TABS: 40.0000 mg | ORAL_TABLET | Freq: Every day | ORAL | 0 refills | Status: DC

## 2017-10-29 ENCOUNTER — Other Ambulatory Visit: Payer: Self-pay

## 2017-10-29 ENCOUNTER — Emergency Department (HOSPITAL_COMMUNITY)
Admission: EM | Admit: 2017-10-29 | Discharge: 2017-10-29 | Disposition: A | Discharge status: Home / Self Care | Payer: 59 | Attending: Emergency Medicine | Admitting: Emergency Medicine

## 2017-10-29 ENCOUNTER — Emergency Department (HOSPITAL_COMMUNITY): Payer: 59

## 2017-10-29 ENCOUNTER — Encounter (HOSPITAL_COMMUNITY): Payer: Self-pay | Admitting: *Deleted

## 2017-10-29 DIAGNOSIS — S8991XA Unspecified injury of right lower leg, initial encounter: Secondary | ICD-10-CM | POA: Diagnosis not present

## 2017-10-29 DIAGNOSIS — W1830XA Fall on same level, unspecified, initial encounter: Secondary | ICD-10-CM | POA: Diagnosis not present

## 2017-10-29 DIAGNOSIS — W19XXXA Unspecified fall, initial encounter: Secondary | ICD-10-CM

## 2017-10-29 DIAGNOSIS — Y929 Unspecified place or not applicable: Secondary | ICD-10-CM | POA: Insufficient documentation

## 2017-10-29 DIAGNOSIS — W208XXA Other cause of strike by thrown, projected or falling object, initial encounter: Secondary | ICD-10-CM | POA: Diagnosis not present

## 2017-10-29 DIAGNOSIS — Z7984 Long term (current) use of oral hypoglycemic drugs: Secondary | ICD-10-CM | POA: Insufficient documentation

## 2017-10-29 DIAGNOSIS — S76111A Strain of right quadriceps muscle, fascia and tendon, initial encounter: Secondary | ICD-10-CM | POA: Diagnosis not present

## 2017-10-29 DIAGNOSIS — M25561 Pain in right knee: Secondary | ICD-10-CM | POA: Diagnosis not present

## 2017-10-29 DIAGNOSIS — I959 Hypotension, unspecified: Secondary | ICD-10-CM | POA: Diagnosis not present

## 2017-10-29 DIAGNOSIS — R52 Pain, unspecified: Secondary | ICD-10-CM | POA: Diagnosis not present

## 2017-10-29 DIAGNOSIS — Z79899 Other long term (current) drug therapy: Secondary | ICD-10-CM | POA: Insufficient documentation

## 2017-10-29 DIAGNOSIS — Y999 Unspecified external cause status: Secondary | ICD-10-CM | POA: Insufficient documentation

## 2017-10-29 DIAGNOSIS — I10 Essential (primary) hypertension: Secondary | ICD-10-CM | POA: Diagnosis not present

## 2017-10-29 DIAGNOSIS — Y939 Activity, unspecified: Secondary | ICD-10-CM | POA: Diagnosis not present

## 2017-10-29 DIAGNOSIS — E119 Type 2 diabetes mellitus without complications: Secondary | ICD-10-CM | POA: Diagnosis not present

## 2017-10-29 NOTE — ED Triage Notes (Signed)
EMS states pt was moving a top to his truck, top fell on his knee, then stepped in hole and feel.Good pulse. Rates pain at 6/10

## 2017-10-29 NOTE — Discharge Instructions (Addendum)
Given x-ray and exam suspect rupture or injury of your quadriceps tendon.  Dr. Lorin Mercy with [facility] orth[facility]ics recommends a knee immobilizer, crutches and follow-up in clinic tomorrow at 3 PM.  Please see his contact information and address above.  Wear your knee immobilizer at all times, use crutches to avoid putting weight on your leg.  Can take 500 to 1000 mg of acetaminophen every 6-8 hours for pain control.  Return to the ER if there is blue discoloration to your foot, loss of sensation, tingling.  Dr Lorin Mercy works in two different offices; call both offices tomorrow morning to confirm the exact location of your appointment at 3pm.

## 2017-10-29 NOTE — ED Provider Notes (Signed)
[place] DEPT Provider Note   CSN: [id number] Arrival date & time: 10/29/17  1259     History   Chief Complaint Chief Complaint  Patient presents with  . Knee Injury    rt    HPI Tyler Andrews is a 62 y.o. male with history of obesity, depression, hypertension, hyperlipidemia, diabetes, TIA is here for evaluation of traumatic, acute, moderate right knee pain.  Patient states that a 150 pound part of his truck fell directly on top of his knee/thigh earlier today, he lost his balance and fell backwards landing on his buttocks.  His pain is localized to the top of the right knee, nonradiating.  5/10.  Worse with movement and palpation.  States he cannot extend his leg.  No interventions for this.  No previous history of orthopedic injuries to the right lower extremities.  There was no head trauma during the fall, LOC, headache.  He denies any other pain.  No associated loss of sensation, tingling to right lower extremity.  No anticoagulants.  HPI  Past Medical History:  Diagnosis Date  . Chicken pox   . Depression   . Diabetes mellitus without complication ([place])   . Hypertension   . Urinary tract infection     Patient Active Problem List   Diagnosis Date Noted  . OSA (obstructive sleep apnea) 05/11/2015  . Chronic anxiety 07/20/2014  . Chronic insomnia 08/21/2013  . Essential hypertension, benign 01/23/2013  . Hyperlipidemia 01/23/2013  . Type 2 diabetes mellitus, uncontrolled ([place]) 01/23/2013  . History of depression 01/23/2013  . Severe obesity (BMI >= 40) ([place]) 01/23/2013    History reviewed. No pertinent surgical history.      Home Medications    Prior to Admission medications   Medication Sig Start Date End Date Taking? Authorizing Provider  aspirin 81 MG tablet Take 81 mg by mouth daily.   Yes [provider]  glimepiride (AMARYL) 4 MG tablet TAKE 1 TABLET BY MOUTH DAILY BEFORE BREAKFAST 01/16/17  Yes Burchette,  Alinda Sierras, MD  Insulin Glargine (BASAGLAR KWIKPEN) 100 UNIT/ML SOPN Inject 0.7 mLs (70 Units total) into the skin daily. 90 day please 09/20/17  Yes Burchette, Alinda Sierras, MD  INVOKANA 300 MG TABS tablet TAKE 1 TABLET BY MOUTH ONCE DAILY 03/29/17  Yes Burchette, Alinda Sierras, MD  lisinopril (PRINIVIL,ZESTRIL) 10 MG tablet TAKE 1 TABLET BY MOUTH ONCE DAILY 03/29/17  Yes Burchette, Alinda Sierras, MD  meclizine (ANTIVERT) 25 MG tablet Take 25 mg by mouth 3 (three) times daily as needed for dizziness. Up to 5 days 10/22/17  Yes [provider]  metFORMIN (GLUCOPHAGE) 500 MG tablet TAKE TWO TABLETS BY MOUTH TWICE DAILY 02/28/17  Yes Burchette, Alinda Sierras, MD  simvastatin (ZOCOR) 40 MG tablet Take 1 tablet (40 mg total) by mouth at bedtime. 10/24/17  Yes Burchette, Alinda Sierras, MD    Family History Family History  Problem Relation Age of Onset  . Diabetes Father   . Heart disease Father 77       CAD  . Cancer Maternal Grandfather        prostate    Social History Social History   Tobacco Use  . Smoking status: Never Smoker  . Smokeless tobacco: Never Used  Substance Use Topics  . Alcohol use: No  . Drug use: No     Allergies   Patient has no known allergies.   Review of Systems Review of Systems  Musculoskeletal: Positive for arthralgias and gait  problem.  All other systems reviewed and are negative.    Physical Exam Updated Vital Signs BP 136/75 (BP Location: Left Arm)   Pulse 97   Temp 98 F (36.7 C) (Oral)   Resp 18   SpO2 99%   Physical Exam  Constitutional: He is oriented to person, place, and time. He appears well-developed and well-nourished.  Non-toxic appearance.  HENT:  Head: Normocephalic.  Right Ear: External ear normal.  Left Ear: External ear normal.  Nose: Nose normal.  Eyes: Conjunctivae and EOM are normal.  Neck: Full passive range of motion without pain.  Cardiovascular: Normal rate.  2+ DP and PT pulses bilaterally.  Pulmonary/Chest: Effort normal. No  tachypnea. No respiratory distress.  Musculoskeletal: Normal range of motion. He exhibits tenderness and deformity.  Right knee in flexed position.  Tenderness and palpable defect 1 to 2 inches above the superior aspect of the right patella.  Patient is unable to extend the knee against resistance or against gravity.  No focal bony tenderness to right patella, lateral/medial joint line, patellar tendon, popliteal space.  Upper thigh and calf muscle groups are soft.  Neurological: He is alert and oriented to person, place, and time.  Sensation to light touch in the lower extremities symmetric and intact.  5/5 dorsiflexion and plantarflexion bilaterally.  Skin: Skin is warm and dry. Capillary refill takes less than 2 seconds.  Psychiatric: His behavior is normal. Thought content normal.     ED Treatments / Results  Labs (all labs ordered are listed, but only abnormal results are displayed) Labs Reviewed - No data to display  EKG None  Radiology Dg Knee 1-2 Views Right  Result Date: 10/29/2017 CLINICAL DATA:  Acute RIGHT knee pain following fall today. Initial encounter. EXAM: RIGHT KNEE - 1-2 VIEW COMPARISON:  None. FINDINGS: This study is limited technically as the patient cannot extend his RIGHT knee. Small bony densities anterior to the distal femur are noted and could represent evidence of quadriceps tendon injury/avulsion fracture. However, no significant soft tissue swelling is noted-correlate clinically. No other fracture, subluxation or dislocation identified. No suspicious focal bony lesions noted. IMPRESSION: Small bony densities anterior to the distal femur - correlate clinically with possibility of quadriceps tendon injury/avulsion fracture. No other acute abnormalities identified. Electronically Signed   By: Margarette Canada M.D.   On: 10/29/2017 14:04    Procedures Procedures (including critical care time)  Medications Ordered in ED Medications - No data to display   Initial  Impression / Assessment and Plan / ED Course  I have reviewed the triage vital signs and the nursing notes.  Pertinent labs & imaging results that were available during my care of the patient were reviewed by me and considered in my medical decision making (see chart for details).  Clinical Course as of Oct 30 1534  Sun Oct 29, 2017  1456 Small bony densities anterior to the distal femur are noted and could represent evidence of quadriceps tendon injury/avulsion fracture. However, no significant soft tissue swelling is noted-correlate clinically.    DG Knee 1-2 Views Right [CG]    Clinical Course User Index [CG] Kinnie Feil, PA-C   Concern for quad tendon partial tear, rupture, strain vs fracture of patella.  X-ray shows bony densities anterior to the distal femur suspicious for quad tendon injury or avulsion fracture.  Exam is most consistent with quad tendon rupture.  Will consult orthopedic surgery to determine appropriate follow-up.  Spoke to Dr Lorin Mercy who recommends long tight  knee immobilizer, crutches and f/u in clinic tomorrow at 3 PM.   Final Clinical Impressions(s) / ED Diagnoses   Final diagnoses:  Traumatic rupture of right quadriceps tendon, initial encounter    ED Discharge Orders    None       Kinnie Feil, PA-C 10/29/17 1536    Maudie Flakes, MD 10/29/17 1623

## 2017-10-30 ENCOUNTER — Ambulatory Visit (INDEPENDENT_AMBULATORY_CARE_PROVIDER_SITE_OTHER): Payer: Self-pay | Admitting: Orthopaedic Surgery

## 2017-10-30 ENCOUNTER — Telehealth (INDEPENDENT_AMBULATORY_CARE_PROVIDER_SITE_OTHER): Payer: Self-pay

## 2017-10-30 DIAGNOSIS — M25561 Pain in right knee: Secondary | ICD-10-CM | POA: Diagnosis not present

## 2017-10-30 NOTE — Telephone Encounter (Signed)
Patient has appt. 10/30/2017 to see Dr. Lorin Mercy

## 2017-10-31 DIAGNOSIS — I10 Essential (primary) hypertension: Secondary | ICD-10-CM | POA: Diagnosis not present

## 2017-10-31 DIAGNOSIS — E119 Type 2 diabetes mellitus without complications: Secondary | ICD-10-CM | POA: Diagnosis not present

## 2017-10-31 DIAGNOSIS — S76111A Strain of right quadriceps muscle, fascia and tendon, initial encounter: Secondary | ICD-10-CM | POA: Diagnosis not present

## 2017-10-31 DIAGNOSIS — G8918 Other acute postprocedural pain: Secondary | ICD-10-CM | POA: Diagnosis not present

## 2017-11-09 DIAGNOSIS — R262 Difficulty in walking, not elsewhere classified: Secondary | ICD-10-CM | POA: Diagnosis not present

## 2017-11-09 DIAGNOSIS — M25561 Pain in right knee: Secondary | ICD-10-CM | POA: Diagnosis not present

## 2017-11-09 DIAGNOSIS — R531 Weakness: Secondary | ICD-10-CM | POA: Diagnosis not present

## 2017-11-15 DIAGNOSIS — R262 Difficulty in walking, not elsewhere classified: Secondary | ICD-10-CM | POA: Diagnosis not present

## 2017-11-15 DIAGNOSIS — M25561 Pain in right knee: Secondary | ICD-10-CM | POA: Diagnosis not present

## 2017-11-15 DIAGNOSIS — R531 Weakness: Secondary | ICD-10-CM | POA: Diagnosis not present

## 2017-11-17 ENCOUNTER — Ambulatory Visit (INDEPENDENT_AMBULATORY_CARE_PROVIDER_SITE_OTHER): Payer: 59 | Admitting: Family Medicine

## 2017-11-17 VITALS — BP 110/64 | HR 94 | Temp 98.2°F | Wt 296.0 lb

## 2017-11-17 DIAGNOSIS — E11649 Type 2 diabetes mellitus with hypoglycemia without coma: Secondary | ICD-10-CM | POA: Diagnosis not present

## 2017-11-17 DIAGNOSIS — I10 Essential (primary) hypertension: Secondary | ICD-10-CM

## 2017-11-17 DIAGNOSIS — E785 Hyperlipidemia, unspecified: Secondary | ICD-10-CM

## 2017-11-17 DIAGNOSIS — F419 Anxiety disorder, unspecified: Secondary | ICD-10-CM | POA: Diagnosis not present

## 2017-11-17 LAB — POCT GLYCOSYLATED HEMOGLOBIN (HGB A1C): HEMOGLOBIN A1C: 8.5 % — AB (ref 4.0–5.6)

## 2017-11-17 MED ORDER — BASAGLAR KWIKPEN 100 UNIT/ML ~~LOC~~ SOPN: 70.0000 [IU] | PEN_INJECTOR | Freq: Every day | SUBCUTANEOUS | 1 refills | Status: DC

## 2017-11-17 MED ORDER — METFORMIN HCL 500 MG PO TABS: ORAL_TABLET | ORAL | 3 refills | Status: DC

## 2017-11-17 MED ORDER — GLIMEPIRIDE 4 MG PO TABS: ORAL_TABLET | ORAL | 3 refills | Status: DC

## 2017-11-17 MED ORDER — LISINOPRIL 10 MG PO TABS: 10.0000 mg | ORAL_TABLET | Freq: Every day | ORAL | 3 refills | Status: DC

## 2017-11-17 MED ORDER — CANAGLIFLOZIN 300 MG PO TABS: 300.0000 mg | ORAL_TABLET | Freq: Every day | ORAL | 11 refills | Status: DC

## 2017-11-17 MED ORDER — SIMVASTATIN 40 MG PO TABS: 40.0000 mg | ORAL_TABLET | Freq: Every day | ORAL | 3 refills | Status: DC

## 2017-11-17 MED ORDER — PAROXETINE HCL 20 MG PO TABS: 20.0000 mg | ORAL_TABLET | Freq: Every day | ORAL | 1 refills | Status: DC

## 2017-11-17 NOTE — Progress Notes (Signed)
  Subjective:     Patient ID: Tyler Andrews, male   DOB: 07-16-1955, 62 y.o.   MRN: [id number]  HPI  Patient seen for medical follow-up. He had injury last month with rupture of right quadriceps tendon. This required surgical repair. He's been very inactive because of that. His recovery is going well thus far.  Type 2 diabetes. History of poor control and poor compliance. Last A1c 8.2%. He is on multiple medications. Not taking invokana regularly because of cost. Still eats lots of starchy snack foods. He has not been taking invokana regularly secondary to cost issues.  History of chronic anxiety. He has done well with Paxil in the past and requests going back on that. We had tapered him off because of concerns about weight gain.  Hyperlipidemia which is treated with statin. He takes this regularly  Past Medical History:  Diagnosis Date  . Chicken pox   . Depression   . Diabetes mellitus without complication ([place])   . Hypertension   . Urinary tract infection    No past surgical history on file.  reports that he has never smoked. He has never used smokeless tobacco. He reports that he does not drink alcohol or use drugs. family history includes Cancer in his maternal grandfather; Diabetes in his father; Heart disease (age of onset: 47) in his father. Allergies  Allergen Reactions  . Trulicity [Dulaglutide] Nausea Only    Review of Systems  Constitutional: Negative for fatigue and unexpected weight change.  Eyes: Negative for visual disturbance.  Respiratory: Negative for cough, chest tightness and shortness of breath.   Cardiovascular: Negative for chest pain, palpitations and leg swelling.  Endocrine: Negative for polydipsia and polyuria.  Genitourinary: Negative for dysuria.  Neurological: Negative for dizziness, syncope, weakness, light-headedness and headaches.       Objective:   Physical Exam  Constitutional: He appears well-developed and well-nourished.   Cardiovascular: Normal rate and regular rhythm.  Pulmonary/Chest: Effort normal and breath sounds normal.  Musculoskeletal:  Patient has right knee in immobilizer. He has wound from recent surgery which appears to be healing well with Steri-Strips in place  Skin: No rash noted.       Assessment:     #1 type 2 diabetes poorly controlled with A1c 8.5%.  Poor compliance with diet and exercise  #2 hypertension stable and at goal  #3 dyslipidemia treated with simvastatin  #4 history of chronic anxiety    Plan:     -Discussed options for diabetes management. We suggested consideration for short acting mealtime insulin. He declines at this point. He has recently not been taking invokana regularly and will get back on that. He does agree to 3 month follow-up A1c and if not closer to goal that point on invokana consider NovoLog or Humalog 3 times daily with meals  -Strongly encouraged to lose some weight. His exercise will be limited for the next couple months as he is in recovery from his surgery  - Start back Paxil 20 mg daily.  Eulas Post MD [facility] at [facility]

## 2017-11-17 NOTE — Patient Instructions (Signed)
Tighten up diet and lose some weight  Let's plan on 3 month follow up and repeat A1C then.

## 2017-11-22 DIAGNOSIS — R531 Weakness: Secondary | ICD-10-CM | POA: Diagnosis not present

## 2017-11-22 DIAGNOSIS — R262 Difficulty in walking, not elsewhere classified: Secondary | ICD-10-CM | POA: Diagnosis not present

## 2017-11-22 DIAGNOSIS — M25561 Pain in right knee: Secondary | ICD-10-CM | POA: Diagnosis not present

## 2017-12-06 DIAGNOSIS — R531 Weakness: Secondary | ICD-10-CM | POA: Diagnosis not present

## 2017-12-06 DIAGNOSIS — R262 Difficulty in walking, not elsewhere classified: Secondary | ICD-10-CM | POA: Diagnosis not present

## 2017-12-06 DIAGNOSIS — M25561 Pain in right knee: Secondary | ICD-10-CM | POA: Diagnosis not present

## 2017-12-12 DIAGNOSIS — R262 Difficulty in walking, not elsewhere classified: Secondary | ICD-10-CM | POA: Diagnosis not present

## 2017-12-12 DIAGNOSIS — M25561 Pain in right knee: Secondary | ICD-10-CM | POA: Diagnosis not present

## 2017-12-12 DIAGNOSIS — R531 Weakness: Secondary | ICD-10-CM | POA: Diagnosis not present

## 2017-12-14 DIAGNOSIS — R262 Difficulty in walking, not elsewhere classified: Secondary | ICD-10-CM | POA: Diagnosis not present

## 2017-12-14 DIAGNOSIS — R531 Weakness: Secondary | ICD-10-CM | POA: Diagnosis not present

## 2017-12-14 DIAGNOSIS — M25561 Pain in right knee: Secondary | ICD-10-CM | POA: Diagnosis not present

## 2017-12-19 DIAGNOSIS — M25561 Pain in right knee: Secondary | ICD-10-CM | POA: Diagnosis not present

## 2017-12-19 DIAGNOSIS — R262 Difficulty in walking, not elsewhere classified: Secondary | ICD-10-CM | POA: Diagnosis not present

## 2017-12-19 DIAGNOSIS — R531 Weakness: Secondary | ICD-10-CM | POA: Diagnosis not present

## 2017-12-21 DIAGNOSIS — R262 Difficulty in walking, not elsewhere classified: Secondary | ICD-10-CM | POA: Diagnosis not present

## 2017-12-21 DIAGNOSIS — R531 Weakness: Secondary | ICD-10-CM | POA: Diagnosis not present

## 2017-12-21 DIAGNOSIS — M25561 Pain in right knee: Secondary | ICD-10-CM | POA: Diagnosis not present

## 2017-12-26 DIAGNOSIS — R262 Difficulty in walking, not elsewhere classified: Secondary | ICD-10-CM | POA: Diagnosis not present

## 2017-12-26 DIAGNOSIS — M25561 Pain in right knee: Secondary | ICD-10-CM | POA: Diagnosis not present

## 2017-12-26 DIAGNOSIS — R531 Weakness: Secondary | ICD-10-CM | POA: Diagnosis not present

## 2017-12-28 DIAGNOSIS — R531 Weakness: Secondary | ICD-10-CM | POA: Diagnosis not present

## 2017-12-28 DIAGNOSIS — R262 Difficulty in walking, not elsewhere classified: Secondary | ICD-10-CM | POA: Diagnosis not present

## 2017-12-28 DIAGNOSIS — M25561 Pain in right knee: Secondary | ICD-10-CM | POA: Diagnosis not present

## 2018-01-02 DIAGNOSIS — R262 Difficulty in walking, not elsewhere classified: Secondary | ICD-10-CM | POA: Diagnosis not present

## 2018-01-02 DIAGNOSIS — R531 Weakness: Secondary | ICD-10-CM | POA: Diagnosis not present

## 2018-01-02 DIAGNOSIS — M25561 Pain in right knee: Secondary | ICD-10-CM | POA: Diagnosis not present

## 2018-01-04 DIAGNOSIS — R531 Weakness: Secondary | ICD-10-CM | POA: Diagnosis not present

## 2018-01-04 DIAGNOSIS — M25561 Pain in right knee: Secondary | ICD-10-CM | POA: Diagnosis not present

## 2018-01-04 DIAGNOSIS — R262 Difficulty in walking, not elsewhere classified: Secondary | ICD-10-CM | POA: Diagnosis not present

## 2018-01-09 DIAGNOSIS — M25561 Pain in right knee: Secondary | ICD-10-CM | POA: Diagnosis not present

## 2018-01-09 DIAGNOSIS — R531 Weakness: Secondary | ICD-10-CM | POA: Diagnosis not present

## 2018-01-09 DIAGNOSIS — R262 Difficulty in walking, not elsewhere classified: Secondary | ICD-10-CM | POA: Diagnosis not present

## 2018-01-11 DIAGNOSIS — R262 Difficulty in walking, not elsewhere classified: Secondary | ICD-10-CM | POA: Diagnosis not present

## 2018-01-11 DIAGNOSIS — R531 Weakness: Secondary | ICD-10-CM | POA: Diagnosis not present

## 2018-01-11 DIAGNOSIS — M25561 Pain in right knee: Secondary | ICD-10-CM | POA: Diagnosis not present

## 2018-01-12 ENCOUNTER — Ambulatory Visit (INDEPENDENT_AMBULATORY_CARE_PROVIDER_SITE_OTHER): Payer: 59 | Admitting: Family Medicine

## 2018-01-12 ENCOUNTER — Other Ambulatory Visit: Payer: Self-pay

## 2018-01-12 ENCOUNTER — Encounter: Payer: Self-pay | Admitting: Family Medicine

## 2018-01-12 VITALS — BP 106/64 | HR 81 | Temp 97.8°F | Wt 307.8 lb

## 2018-01-12 DIAGNOSIS — E11649 Type 2 diabetes mellitus with hypoglycemia without coma: Secondary | ICD-10-CM

## 2018-01-12 DIAGNOSIS — Z1211 Encounter for screening for malignant neoplasm of colon: Secondary | ICD-10-CM

## 2018-01-12 DIAGNOSIS — I10 Essential (primary) hypertension: Secondary | ICD-10-CM | POA: Diagnosis not present

## 2018-01-12 DIAGNOSIS — G4733 Obstructive sleep apnea (adult) (pediatric): Secondary | ICD-10-CM

## 2018-01-12 DIAGNOSIS — Z23 Encounter for immunization: Secondary | ICD-10-CM

## 2018-01-12 NOTE — Progress Notes (Signed)
Subjective:     Patient ID: Tyler Andrews, male   DOB: 12/12/1955, 62 y.o.   MRN: [id number]  HPI Patient has chronic problems including history of morbid obesity, chronic insomnia, hyperlipidemia, type 2 diabetes, obstructive sleep apnea, hypertension.  He is here today requesting a couple of referrals.  He is overdue for colonoscopy.  He is currently living up in [place] and would like to find someone in [place]. No recent stool changes.  History of obstructive sleep apnea.  He states that current equipment is over 51 years old and has not had a sleep study in over 10 years.  He had some new equipment which is malfunctioning and current CPAP is over 55 years old.  Does not feel rested in the mornings when he gets up.  He is using his CPAP regularly but not seeing benefit he has previously  His diabetes has been poorly controlled.  He has unfortunately gained some weight since last visit.  His weight has gone up from 296 to current weight of 307 since August.  No peripheral edema.  Wt Readings from Last 3 Encounters:  01/12/18 (!) 307 lb 12.8 oz (139.6 kg)  11/17/17 296 lb (134.3 kg)  02/22/17 (!) 306 lb 3.2 oz (138.9 kg)     Past Medical History:  Diagnosis Date  . Chicken pox   . Depression   . Diabetes mellitus without complication ([place])   . Hypertension   . Urinary tract infection    Past Surgical History:  Procedure Laterality Date  . KNEE SURGERY  10/2017   Right knee, tendon rupture    reports that he has never smoked. He has never used smokeless tobacco. He reports that he does not drink alcohol or use drugs. family history includes Cancer in his maternal grandfather; Diabetes in his father; Heart disease (age of onset: 41) in his father. Allergies  Allergen Reactions  . Trulicity [Dulaglutide] Nausea Only     Review of Systems  Constitutional: Positive for fatigue.  Eyes: Negative for visual disturbance.  Respiratory: Negative for cough, chest  tightness and shortness of breath.   Cardiovascular: Negative for chest pain, palpitations and leg swelling.  Gastrointestinal: Negative for abdominal pain and blood in stool.  Endocrine: Negative for polydipsia and polyuria.  Genitourinary: Negative for dysuria.  Neurological: Negative for dizziness, syncope, weakness, light-headedness and headaches.       Objective:   Physical Exam  Constitutional: He is oriented to person, place, and time. He appears well-developed and well-nourished.  HENT:  Mouth/Throat: Oropharynx is clear and moist.  Cardiovascular: Normal rate and regular rhythm.  Pulmonary/Chest: Effort normal and breath sounds normal.  Musculoskeletal: He exhibits no edema.  Neurological: He is alert and oriented to person, place, and time.  Psychiatric: He has a normal mood and affect.       Assessment:     #1 obstructive sleep apnea.  Patient reporting increased fatigue and daytime somnolence and requesting repeat sleep study.  He would like to try to find a sleep medicine specialist somewhere closer to his home  #2 type 2 diabetes poorly controlled with history of poor compliance  #3 hypertension stable and at goal  #4 morbid obesity with multiple comorbidities including hypertension, obstructive sleep apnea, type 2 diabetes, dyslipidemia    Plan:     -Set up referral for repeat colonoscopy -We will try to set up referral for pulmonologist somewhere near [facility] or [place] -Flu vaccine given -We strongly advised weight loss as we have  multiple times in the past.  He is not interested in weight loss programs.  Very high risk for complications from his morbid obesity -He has scheduled follow-up in November to reassess-check A1c then  Eulas Post MD [facility] at [facility]

## 2018-01-17 DIAGNOSIS — M25561 Pain in right knee: Secondary | ICD-10-CM | POA: Diagnosis not present

## 2018-01-17 DIAGNOSIS — R531 Weakness: Secondary | ICD-10-CM | POA: Diagnosis not present

## 2018-01-17 DIAGNOSIS — R262 Difficulty in walking, not elsewhere classified: Secondary | ICD-10-CM | POA: Diagnosis not present

## 2018-01-19 DIAGNOSIS — M25561 Pain in right knee: Secondary | ICD-10-CM | POA: Diagnosis not present

## 2018-01-19 DIAGNOSIS — R262 Difficulty in walking, not elsewhere classified: Secondary | ICD-10-CM | POA: Diagnosis not present

## 2018-01-19 DIAGNOSIS — R531 Weakness: Secondary | ICD-10-CM | POA: Diagnosis not present

## 2018-01-22 ENCOUNTER — Ambulatory Visit: Payer: 59 | Admitting: Family Medicine

## 2018-01-23 DIAGNOSIS — R531 Weakness: Secondary | ICD-10-CM | POA: Diagnosis not present

## 2018-01-23 DIAGNOSIS — M25561 Pain in right knee: Secondary | ICD-10-CM | POA: Diagnosis not present

## 2018-01-23 DIAGNOSIS — R262 Difficulty in walking, not elsewhere classified: Secondary | ICD-10-CM | POA: Diagnosis not present

## 2018-01-24 DIAGNOSIS — G4730 Sleep apnea, unspecified: Secondary | ICD-10-CM | POA: Diagnosis not present

## 2018-01-24 DIAGNOSIS — Z6841 Body Mass Index (BMI) 40.0 and over, adult: Secondary | ICD-10-CM | POA: Diagnosis not present

## 2018-01-25 DIAGNOSIS — M25561 Pain in right knee: Secondary | ICD-10-CM | POA: Diagnosis not present

## 2018-01-25 DIAGNOSIS — R262 Difficulty in walking, not elsewhere classified: Secondary | ICD-10-CM | POA: Diagnosis not present

## 2018-01-25 DIAGNOSIS — R531 Weakness: Secondary | ICD-10-CM | POA: Diagnosis not present

## 2018-01-30 DIAGNOSIS — R262 Difficulty in walking, not elsewhere classified: Secondary | ICD-10-CM | POA: Diagnosis not present

## 2018-01-30 DIAGNOSIS — R531 Weakness: Secondary | ICD-10-CM | POA: Diagnosis not present

## 2018-01-30 DIAGNOSIS — M25561 Pain in right knee: Secondary | ICD-10-CM | POA: Diagnosis not present

## 2018-01-31 DIAGNOSIS — G4710 Hypersomnia, unspecified: Secondary | ICD-10-CM | POA: Diagnosis not present

## 2018-01-31 DIAGNOSIS — R0683 Snoring: Secondary | ICD-10-CM | POA: Diagnosis not present

## 2018-02-01 DIAGNOSIS — R262 Difficulty in walking, not elsewhere classified: Secondary | ICD-10-CM | POA: Diagnosis not present

## 2018-02-01 DIAGNOSIS — M25561 Pain in right knee: Secondary | ICD-10-CM | POA: Diagnosis not present

## 2018-02-01 DIAGNOSIS — R531 Weakness: Secondary | ICD-10-CM | POA: Diagnosis not present

## 2018-02-12 ENCOUNTER — Other Ambulatory Visit: Payer: Self-pay | Admitting: Family Medicine

## 2018-02-13 DIAGNOSIS — G4733 Obstructive sleep apnea (adult) (pediatric): Secondary | ICD-10-CM | POA: Diagnosis not present

## 2018-02-13 DIAGNOSIS — R0683 Snoring: Secondary | ICD-10-CM | POA: Diagnosis not present

## 2018-02-13 DIAGNOSIS — G4710 Hypersomnia, unspecified: Secondary | ICD-10-CM | POA: Diagnosis not present

## 2018-02-13 HISTORY — PX: COLON SURGERY: SHX602

## 2018-02-14 DIAGNOSIS — K635 Polyp of colon: Secondary | ICD-10-CM | POA: Diagnosis not present

## 2018-02-14 DIAGNOSIS — G4730 Sleep apnea, unspecified: Secondary | ICD-10-CM | POA: Diagnosis not present

## 2018-02-14 DIAGNOSIS — G4710 Hypersomnia, unspecified: Secondary | ICD-10-CM | POA: Diagnosis not present

## 2018-02-14 DIAGNOSIS — K621 Rectal polyp: Secondary | ICD-10-CM | POA: Diagnosis not present

## 2018-02-14 DIAGNOSIS — Z1211 Encounter for screening for malignant neoplasm of colon: Secondary | ICD-10-CM | POA: Diagnosis not present

## 2018-02-14 DIAGNOSIS — R0683 Snoring: Secondary | ICD-10-CM | POA: Diagnosis not present

## 2018-02-14 DIAGNOSIS — D128 Benign neoplasm of rectum: Secondary | ICD-10-CM | POA: Diagnosis not present

## 2018-02-14 DIAGNOSIS — K5730 Diverticulosis of large intestine without perforation or abscess without bleeding: Secondary | ICD-10-CM | POA: Diagnosis not present

## 2018-02-19 ENCOUNTER — Ambulatory Visit: Payer: 59 | Admitting: Family Medicine

## 2018-03-19 ENCOUNTER — Encounter: Payer: Self-pay | Admitting: Family Medicine

## 2018-03-19 ENCOUNTER — Ambulatory Visit (INDEPENDENT_AMBULATORY_CARE_PROVIDER_SITE_OTHER): Payer: 59 | Admitting: Family Medicine

## 2018-03-19 ENCOUNTER — Other Ambulatory Visit: Payer: Self-pay

## 2018-03-19 VITALS — BP 118/68 | HR 95 | Temp 98.4°F | Ht 63.5 in | Wt 310.4 lb

## 2018-03-19 DIAGNOSIS — Z8619 Personal history of other infectious and parasitic diseases: Secondary | ICD-10-CM | POA: Diagnosis not present

## 2018-03-19 DIAGNOSIS — I10 Essential (primary) hypertension: Secondary | ICD-10-CM | POA: Diagnosis not present

## 2018-03-19 DIAGNOSIS — E11649 Type 2 diabetes mellitus with hypoglycemia without coma: Secondary | ICD-10-CM | POA: Diagnosis not present

## 2018-03-19 DIAGNOSIS — E785 Hyperlipidemia, unspecified: Secondary | ICD-10-CM

## 2018-03-19 LAB — POCT GLYCOSYLATED HEMOGLOBIN (HGB A1C): HEMOGLOBIN A1C: 8.6 % — AB (ref 4.0–5.6)

## 2018-03-19 MED ORDER — INSULIN ASPART 100 UNIT/ML FLEXPEN: PEN_INJECTOR | SUBCUTANEOUS | 3 refills | Status: DC

## 2018-03-19 MED ORDER — ACYCLOVIR 800 MG PO TABS: 800.0000 mg | ORAL_TABLET | Freq: Two times a day (BID) | ORAL | 2 refills | Status: DC

## 2018-03-19 NOTE — Progress Notes (Signed)
Subjective:     Patient ID: Tyler Andrews, male   DOB: Aug 19, 1955, 62 y.o.   MRN: [id number]  HPI Patient is seen for medical follow-up.  He has morbid obesity with multiple comorbidities including type 2 diabetes, hyperlipidemia, obstructive sleep apnea, hypertension.  We had referred him last visit the pulmonologist and he had recent repeat sleep study done.  He also just had recent colonoscopy and that went well.  Type 2 diabetes.  History of poor control and poor compliance.  No consistent exercise.  has had some ongoing knee issues and feels that is improving.  His weight is up another 3 pounds.  Current diabetes regimen is basaglar insulin 70 units daily, Invokana 300 mg daily, metformin 1000 mg twice daily, and Amaryl 4 mg daily.  He had previous intolerance with GLP-1 medication  Other medications reviewed and compliant with all.  He is on Paxil for recurrent depression and we had mentioned in the past that this can promote weight gain but he has been very reluctant to make changes with that  Requesting refill for acyclovir which she takes as needed for herpes flares  Past Medical History:  Diagnosis Date  . Chicken pox   . Depression   . Diabetes mellitus without complication ([place])   . Hypertension   . Urinary tract infection    Past Surgical History:  Procedure Laterality Date  . COLON SURGERY  02/13/2018   Dr. Celene Skeen in [place]  . KNEE SURGERY  10/2017   Right knee, tendon rupture    reports that he has never smoked. He has never used smokeless tobacco. He reports that he does not drink alcohol or use drugs. family history includes Cancer in his maternal grandfather; Diabetes in his father; Heart disease (age of onset: 50) in his father. Allergies  Allergen Reactions  . Trulicity [Dulaglutide] Nausea Only     Review of Systems  Constitutional: Negative for fatigue and unexpected weight change.  Eyes: Negative for visual disturbance.  Respiratory: Negative  for cough, chest tightness and shortness of breath.   Cardiovascular: Negative for chest pain, palpitations and leg swelling.  Endocrine: Negative for polydipsia and polyuria.  Neurological: Negative for dizziness, syncope, weakness, light-headedness and headaches.       Objective:   Physical Exam Constitutional:      Appearance: He is well-developed.  HENT:     Right Ear: External ear normal.     Left Ear: External ear normal.  Eyes:     Pupils: Pupils are equal, round, and reactive to light.  Neck:     Musculoskeletal: Neck supple.     Thyroid: No thyromegaly.  Cardiovascular:     Rate and Rhythm: Normal rate and regular rhythm.  Pulmonary:     Effort: Pulmonary effort is normal. No respiratory distress.     Breath sounds: Normal breath sounds. No wheezing or rales.  Neurological:     Mental Status: He is alert and oriented to person, place, and time.        Assessment:     #1 morbid obesity with multiple comorbidities as below  #2 type 2 diabetes poorly controlled with A1c 8.6%  #3 dyslipidemia  #4 history of herpes genitalis  #5 hypertension well controlled    Plan:     -Strongly advocated additional weight loss.  He will try to step up his activities. -Discontinue glimepiride -Start NovoLog 5 units 3 times daily with meals.  He will also continue with his basaglar, Invokana, and  metformin.  Previous intolerance with Trulicity -Refilled acyclovir for as needed use -Monitor blood sugars more regularly and we have also advised that he check some blood sugars prior to lunch and dinner and bring back several readings for review at follow-up so we can adjust his insulin. -Schedule follow-up for 3 months  Eulas Post MD [facility] at [facility]

## 2018-03-19 NOTE — Patient Instructions (Signed)
STOP the Amaryl 4 mg   Start the Novolog 5 units three times daily with meals.    Stay on same dose of Basaglar  Record blood sugars at different mealtimes and bring in for review at follow up.

## 2018-03-21 ENCOUNTER — Telehealth: Payer: Self-pay | Admitting: *Deleted

## 2018-03-21 NOTE — Telephone Encounter (Signed)
Prior auth for Novolog Flexpen 100unit/ml pen injector could not be submitted via [email or web address].  I called the [employer] help desk at [phone] and spoke with Ciara.  Information was given and she stated the case is pending and a reply will be sent via fax.  I called the pt and informed him of this also.

## 2018-03-26 ENCOUNTER — Other Ambulatory Visit: Payer: Self-pay

## 2018-03-26 MED ORDER — INSULIN LISPRO (1 UNIT DIAL) 100 UNIT/ML (KWIKPEN): 5.0000 [IU] | PEN_INJECTOR | Freq: Three times a day (TID) | SUBCUTANEOUS | 3 refills | Status: DC

## 2018-03-26 NOTE — Telephone Encounter (Signed)
Please see messages. Do you want me to send Humalog? Please advise.

## 2018-03-26 NOTE — Telephone Encounter (Signed)
Fax received from [employer] stating the request was denied and this was given to Dr Burchette's asst.

## 2018-03-26 NOTE — Telephone Encounter (Signed)
Humalog has been sent to the pharmacy for the patient.

## 2018-03-26 NOTE — Telephone Encounter (Signed)
Yes- if there is a chance they will cover the Humalog.

## 2018-04-11 NOTE — Telephone Encounter (Signed)
Copied from [place] [id number]. Topic: General - Other >> Apr 10, 2018  4:25 PM Virl Axe D wrote: Reason for CRM: Lovena Le with [employer] called to speak with who handles Dr. Erick Blinks prior authorizations. Please advise. CB# [phone] Reference [id number]

## 2018-04-11 NOTE — Telephone Encounter (Signed)
I called the number below and spoke with Marianna Fuss.  I informed Marianna Fuss the request was denied and the physician is aware of this.

## 2018-07-10 ENCOUNTER — Encounter: Payer: 59 | Admitting: Family Medicine

## 2018-07-25 ENCOUNTER — Other Ambulatory Visit: Payer: Self-pay | Admitting: Family Medicine

## 2018-07-26 NOTE — Telephone Encounter (Signed)
Called patient and was not able to leave a voice message due to full mailbox. Patient is due for a diabetic follow up and needs to have a Doxy virtual appointment set up so we can send in his refills.  OK for PEC to discuss/advise/schedule patient for diabetic follow up.  CRM Created.

## 2018-09-03 ENCOUNTER — Other Ambulatory Visit: Payer: Self-pay

## 2018-09-03 ENCOUNTER — Ambulatory Visit (INDEPENDENT_AMBULATORY_CARE_PROVIDER_SITE_OTHER): Payer: 59 | Admitting: Family Medicine

## 2018-09-03 DIAGNOSIS — E11649 Type 2 diabetes mellitus with hypoglycemia without coma: Secondary | ICD-10-CM | POA: Diagnosis not present

## 2018-09-03 DIAGNOSIS — E785 Hyperlipidemia, unspecified: Secondary | ICD-10-CM

## 2018-09-03 DIAGNOSIS — I10 Essential (primary) hypertension: Secondary | ICD-10-CM | POA: Diagnosis not present

## 2018-09-03 DIAGNOSIS — Z8659 Personal history of other mental and behavioral disorders: Secondary | ICD-10-CM

## 2018-09-03 MED ORDER — BASAGLAR KWIKPEN 100 UNIT/ML ~~LOC~~ SOPN: 70.0000 [IU] | PEN_INJECTOR | Freq: Every day | SUBCUTANEOUS | 1 refills | Status: DC

## 2018-09-03 MED ORDER — INSULIN LISPRO (1 UNIT DIAL) 100 UNIT/ML (KWIKPEN)
PEN_INJECTOR | SUBCUTANEOUS | 3 refills | Status: DC
Start: 2018-09-03 — End: 2018-12-31

## 2018-09-03 MED ORDER — PAROXETINE HCL 20 MG PO TABS: 20.0000 mg | ORAL_TABLET | Freq: Every day | ORAL | 3 refills | Status: DC

## 2018-09-03 NOTE — Progress Notes (Signed)
Patient ID: Tyler Andrews, male   DOB: 21-Jun-1955, 63 y.o.   MRN: [id number]  This visit type was conducted due to national recommendations for restrictions regarding the COVID-19 pandemic in an effort to limit this patient's exposure and mitigate transmission in our community.   Virtual Visit via Video Note  I connected with Ernst Spell on 09/03/18 at  4:00 PM EDT by a video enabled telemedicine application and verified that I am speaking with the correct person using two identifiers.  Location patient: home Location provider:work or home office Persons participating in the virtual visit: patient, provider  I discussed the limitations of evaluation and management by telemedicine and the availability of in person appointments. The patient expressed understanding and agreed to proceed.   HPI:  Patient needs refills of several medications.  He has multiple medical problems including morbid obesity, hypertension, obstructive sleep apnea, type 2 diabetes, hyperlipidemia, history of recurrent depression  He needs refills of Paxil, Humalog, and Basaglar.  He has had poorly controlled diabetes.  Last A1c was 8.6% back in December.  Fasting blood sugars recently about 170-180.  He is currently on Humalog 5 units 3 times daily with meals and takes 70 units of basal gar.  He is also on metformin and Invokana.  No recent hypoglycemia.  Denies any side effects from medications.  Previous intolerance with Trulicity  He is on simvastatin for hyperlipidemia.  He has been on Paxil 20 mg daily for several years for depression.  We had previously discussed possible tapering off because of concerns for weight gain but he declined.   ROS: See pertinent positives and negatives per HPI.  Past Medical History:  Diagnosis Date  . Chicken pox   . Depression   . Diabetes mellitus without complication ([place])   . Hypertension   . Urinary tract infection     Past Surgical History:  Procedure Laterality  Date  . COLON SURGERY  02/13/2018   Dr. Celene Skeen in [place]  . KNEE SURGERY  10/2017   Right knee, tendon rupture    Family History  Problem Relation Age of Onset  . Diabetes Father   . Heart disease Father 59       CAD  . Cancer Maternal Grandfather        prostate    SOCIAL HX: Non-smoker   Current Outpatient Medications:  .  acyclovir (ZOVIRAX) 800 MG tablet, Take 1 tablet (800 mg total) by mouth 2 (two) times daily., Disp: 60 tablet, Rfl: 2 .  aspirin 81 MG tablet, Take 81 mg by mouth daily., Disp: , Rfl:  .  canagliflozin (INVOKANA) 300 MG TABS tablet, Take 1 tablet (300 mg total) by mouth daily., Disp: 30 tablet, Rfl: 11 .  Insulin Glargine (BASAGLAR KWIKPEN) 100 UNIT/ML SOPN, Inject 0.7 mLs (70 Units total) into the skin daily. 90 day please, Disp: 30 mL, Rfl: 1 .  insulin lispro (HUMALOG KWIKPEN) 100 UNIT/ML KwikPen, Inject 8 units subcutaneously tid with meals., Disp: 21 mL, Rfl: 3 .  lisinopril (PRINIVIL,ZESTRIL) 10 MG tablet, Take 1 tablet (10 mg total) by mouth daily., Disp: 90 tablet, Rfl: 3 .  meclizine (ANTIVERT) 25 MG tablet, Take 25 mg by mouth 3 (three) times daily as needed for dizziness. Up to 5 days, Disp: , Rfl: 0 .  metFORMIN (GLUCOPHAGE) 500 MG tablet, TAKE TWO TABLETS BY MOUTH TWICE DAILY, Disp: 360 tablet, Rfl: 3 .  PARoxetine (PAXIL) 20 MG tablet, Take 1 tablet (20 mg total) by mouth daily.,  Disp: 90 tablet, Rfl: 3 .  simvastatin (ZOCOR) 40 MG tablet, Take 1 tablet (40 mg total) by mouth at bedtime., Disp: 90 tablet, Rfl: 3  EXAM:  VITALS per patient if applicable:  GENERAL: alert, oriented, appears well and in no acute distress  HEENT: atraumatic, conjunttiva clear, no obvious abnormalities on inspection of external nose and ears  NECK: normal movements of the head and neck  LUNGS: on inspection no signs of respiratory distress, breathing rate appears normal, no obvious gross SOB, gasping or wheezing  CV: no obvious cyanosis  MS: moves all  visible extremities without noticeable abnormality  PSYCH/NEURO: pleasant and cooperative, no obvious depression or anxiety, speech and thought processing grossly intact  ASSESSMENT AND PLAN:  Discussed the following assessment and plan:  #1 type 2 diabetes.  History of poor control.  History of poor compliance.  Prior intolerance with GLP-1 medication  -Increase Humalog to 8 units 3 times daily with meals -Monitor blood sugars and try to give [place] some follow-up readings over the next few weeks  #2 hyperlipidemia  -Patient will schedule complete physical for August and we will plan to recheck lipids then  #3 history of recurrent depression stable on Paxil  -Refill Paxil for 1 year    I discussed the assessment and treatment plan with the patient. The patient was provided an opportunity to ask questions and all were answered. The patient agreed with the plan and demonstrated an understanding of the instructions.   The patient was advised to call back or seek an in-person evaluation if the symptoms worsen or if the condition fails to improve as anticipated.   Carolann Littler, MD

## 2018-09-04 ENCOUNTER — Telehealth: Payer: Self-pay

## 2018-09-04 NOTE — Telephone Encounter (Signed)
Called patient and not able to leave a voice message due to full mailbox.  Was calling to set up CPE. I will send a MyChart message as Dr. Elease Hashimoto would like to have him do in August.  CRM Created.

## 2018-11-08 ENCOUNTER — Other Ambulatory Visit: Payer: Self-pay | Admitting: Family Medicine

## 2018-11-22 ENCOUNTER — Other Ambulatory Visit: Payer: Self-pay | Admitting: Family Medicine

## 2018-11-24 ENCOUNTER — Other Ambulatory Visit: Payer: Self-pay | Admitting: Family Medicine

## 2018-12-07 ENCOUNTER — Other Ambulatory Visit: Payer: Self-pay | Admitting: Family Medicine

## 2018-12-23 ENCOUNTER — Other Ambulatory Visit: Payer: Self-pay | Admitting: Family Medicine

## 2018-12-28 ENCOUNTER — Ambulatory Visit: Payer: 59 | Admitting: Family Medicine

## 2018-12-30 ENCOUNTER — Other Ambulatory Visit: Payer: Self-pay | Admitting: Family Medicine

## 2018-12-31 ENCOUNTER — Other Ambulatory Visit: Payer: Self-pay

## 2018-12-31 ENCOUNTER — Encounter: Payer: Self-pay | Admitting: Family Medicine

## 2018-12-31 ENCOUNTER — Ambulatory Visit (INDEPENDENT_AMBULATORY_CARE_PROVIDER_SITE_OTHER): Payer: 59 | Admitting: Family Medicine

## 2018-12-31 VITALS — BP 128/62 | HR 101 | Temp 96.7°F | Wt 310.1 lb

## 2018-12-31 DIAGNOSIS — Z23 Encounter for immunization: Secondary | ICD-10-CM

## 2018-12-31 DIAGNOSIS — E11649 Type 2 diabetes mellitus with hypoglycemia without coma: Secondary | ICD-10-CM

## 2018-12-31 DIAGNOSIS — E785 Hyperlipidemia, unspecified: Secondary | ICD-10-CM

## 2018-12-31 DIAGNOSIS — I10 Essential (primary) hypertension: Secondary | ICD-10-CM | POA: Diagnosis not present

## 2018-12-31 MED ORDER — PAROXETINE HCL 20 MG PO TABS: 20.0000 mg | ORAL_TABLET | Freq: Every day | ORAL | 3 refills | Status: DC

## 2018-12-31 MED ORDER — CANAGLIFLOZIN 300 MG PO TABS: ORAL_TABLET | ORAL | 3 refills | Status: DC

## 2018-12-31 MED ORDER — LISINOPRIL 10 MG PO TABS: 10.0000 mg | ORAL_TABLET | Freq: Every day | ORAL | 3 refills | Status: DC

## 2018-12-31 MED ORDER — INSULIN LISPRO (1 UNIT DIAL) 100 UNIT/ML (KWIKPEN): PEN_INJECTOR | SUBCUTANEOUS | 3 refills | Status: DC

## 2018-12-31 MED ORDER — METFORMIN HCL 500 MG PO TABS: ORAL_TABLET | ORAL | 3 refills | Status: DC

## 2018-12-31 MED ORDER — BASAGLAR KWIKPEN 100 UNIT/ML ~~LOC~~ SOPN: PEN_INJECTOR | SUBCUTANEOUS | 3 refills | Status: DC

## 2018-12-31 MED ORDER — SIMVASTATIN 40 MG PO TABS: 40.0000 mg | ORAL_TABLET | Freq: Every day | ORAL | 3 refills | Status: DC

## 2018-12-31 NOTE — Telephone Encounter (Signed)
Message routed to PCP CMA  

## 2018-12-31 NOTE — Progress Notes (Signed)
Subjective:     Patient ID: Tyler Andrews, male   DOB: 28-Apr-1955, 63 y.o.   MRN: [id number]  HPI Patient is here for medical follow-up.  He is still working full-time but his [employer] will be closed sometime next year and is looking at retiring at that point.  His chronic problems include history of severe obesity, hyperlipidemia, type 2 diabetes, hypertension, obstructive sleep apnea  He is overdue for labs.  Medications reviewed.  Not monitoring blood sugars real regularly.  We had added mealtime insulin with Humalog currently 8 units 3 times daily and he feels this has helped his blood sugars.  Last A1c was 8.6%.  He has not been monitoring his blood pressure regularly.  Initial blood pressure here up today but improved after rest.  Patient remains on simvastatin, Paxil, metformin, lisinopril, Humalog, Basaglar, Invokana.  He needs refills basically of all medications.  His eye exam is up-to-date.  Colonoscopy up-to-date.  Past Medical History:  Diagnosis Date  . Chicken pox   . Depression   . Diabetes mellitus without complication ([place])   . Hypertension   . Urinary tract infection    Past Surgical History:  Procedure Laterality Date  . COLON SURGERY  02/13/2018   Dr. Celene Skeen in [place]  . KNEE SURGERY  10/2017   Right knee, tendon rupture    reports that he has never smoked. He has never used smokeless tobacco. He reports that he does not drink alcohol or use drugs. family history includes Cancer in his maternal grandfather; Diabetes in his father; Heart disease (age of onset: 48) in his father. Allergies  Allergen Reactions  . Trulicity [Dulaglutide] Nausea Only     Review of Systems  Constitutional: Negative for chills, fatigue and fever.  Eyes: Negative for visual disturbance.  Respiratory: Negative for cough, chest tightness and shortness of breath.   Cardiovascular: Negative for chest pain, palpitations and leg swelling.  Gastrointestinal: Negative for  abdominal pain.  Endocrine: Negative for polydipsia and polyuria.  Genitourinary: Negative for dysuria.  Neurological: Negative for dizziness, syncope, weakness, light-headedness and headaches.       Objective:   Physical Exam Constitutional:      Appearance: He is well-developed.  HENT:     Right Ear: External ear normal.     Left Ear: External ear normal.  Eyes:     Pupils: Pupils are equal, round, and reactive to light.  Neck:     Musculoskeletal: Neck supple.     Thyroid: No thyromegaly.  Cardiovascular:     Rate and Rhythm: Normal rate and regular rhythm.  Pulmonary:     Effort: Pulmonary effort is normal. No respiratory distress.     Breath sounds: Normal breath sounds. No wheezing or rales.  Skin:    Comments: Feet reveal no skin lesions. Good distal foot pulses. Good capillary refill. No calluses. Normal sensation with monofilament testing   Neurological:     Mental Status: He is alert and oriented to person, place, and time.        Assessment:     #1 type 2 diabetes.  History of poor control.  No evidence for neuropathy on exam today  #2 hypertension.  Initial elevated reading but repeat after rest was at goal  #3 dyslipidemia treated with simvastatin      Plan:     -Check labs with A1c, lipid, hepatic, basic metabolic panel -Flu vaccine given -Strongly encouraged to lose some weight -Stressed importance of establishing more consistent diabetic  follow-up every 3 months -Continue with regular eye exams  Eulas Post MD [facility] at [facility]

## 2019-01-01 ENCOUNTER — Other Ambulatory Visit: Payer: Self-pay

## 2019-01-01 LAB — BASIC METABOLIC PANEL
BUN: 22 mg/dL (ref 6–23)
CO2: 24 mEq/L (ref 19–32)
Calcium: 9.9 mg/dL (ref 8.4–10.5)
Chloride: 105 mEq/L (ref 96–112)
Creatinine, Ser: 0.88 mg/dL (ref 0.40–1.50)
GFR: 87.07 mL/min (ref >60.00)
Glucose, Bld: 87 mg/dL (ref 70–99)
Potassium: 4.4 mEq/L (ref 3.5–5.1)
Sodium: 140 mEq/L (ref 135–145)

## 2019-01-01 LAB — LIPID PANEL
Cholesterol: 124 mg/dL (ref 0–200)
HDL: 42 mg/dL (ref >39.00)
LDL Cholesterol: 53 mg/dL (ref 0–99)
NonHDL: 81.57
Total CHOL/HDL Ratio: 3
Triglycerides: 143 mg/dL (ref 0.0–149.0)
VLDL: 28.6 mg/dL (ref 0.0–40.0)

## 2019-01-01 LAB — HEMOGLOBIN A1C: Hgb A1c MFr Bld: 8 % — ABNORMAL HIGH (ref 4.6–6.5)

## 2019-01-01 LAB — HEPATIC FUNCTION PANEL
ALT: 16 U/L (ref 0–53)
AST: 11 U/L (ref 0–37)
Albumin: 4.1 g/dL (ref 3.5–5.2)
Alkaline Phosphatase: 69 U/L (ref 39–117)
Bilirubin, Direct: 0.1 mg/dL (ref 0.0–0.3)
Total Bilirubin: 0.3 mg/dL (ref 0.2–1.2)
Total Protein: 7 g/dL (ref 6.0–8.3)

## 2019-01-01 MED ORDER — INSULIN LISPRO (1 UNIT DIAL) 100 UNIT/ML (KWIKPEN): PEN_INJECTOR | SUBCUTANEOUS | 3 refills | Status: DC

## 2019-01-21 ENCOUNTER — Telehealth: Payer: Self-pay | Admitting: Family Medicine

## 2019-01-21 ENCOUNTER — Other Ambulatory Visit: Payer: Self-pay | Admitting: *Deleted

## 2019-01-21 DIAGNOSIS — E11649 Type 2 diabetes mellitus with hypoglycemia without coma: Secondary | ICD-10-CM

## 2019-01-21 MED ORDER — JARDIANCE 10 MG PO TABS: 10.0000 mg | ORAL_TABLET | Freq: Every day | ORAL | 3 refills | Status: DC

## 2019-01-21 NOTE — Telephone Encounter (Signed)
Patient notified to stop taking Invokana. Patient instructed per Dr. Elease Hashimoto to start taking Jardiance 10 mg once daily. Rx sent to [facility] in [place], [place] per patient request

## 2019-01-21 NOTE — Telephone Encounter (Signed)
Discontinue Invokana.  Start Jardiance 10 mg once daily #90 with 3 refills

## 2019-01-21 NOTE — Telephone Encounter (Signed)
Patient's insurance no longer accepts canagliflozin (INVOKANA) 300 MG TABS tablet   medication.  They suggest Jardiance.  Please advise.

## 2019-03-30 ENCOUNTER — Other Ambulatory Visit: Payer: Self-pay | Admitting: Family Medicine

## 2019-04-01 ENCOUNTER — Ambulatory Visit (INDEPENDENT_AMBULATORY_CARE_PROVIDER_SITE_OTHER): Payer: 59 | Admitting: Family Medicine

## 2019-04-01 ENCOUNTER — Encounter: Payer: Self-pay | Admitting: Family Medicine

## 2019-04-01 ENCOUNTER — Other Ambulatory Visit: Payer: Self-pay

## 2019-04-01 VITALS — BP 124/64 | HR 100 | Temp 98.2°F | Ht 66.0 in | Wt 303.4 lb

## 2019-04-01 DIAGNOSIS — E11649 Type 2 diabetes mellitus with hypoglycemia without coma: Secondary | ICD-10-CM

## 2019-04-01 DIAGNOSIS — E785 Hyperlipidemia, unspecified: Secondary | ICD-10-CM

## 2019-04-01 DIAGNOSIS — I10 Essential (primary) hypertension: Secondary | ICD-10-CM | POA: Diagnosis not present

## 2019-04-01 LAB — POCT GLYCOSYLATED HEMOGLOBIN (HGB A1C): Hemoglobin A1C: 7.6 % — AB (ref 4.0–5.6)

## 2019-04-01 NOTE — Progress Notes (Signed)
  Subjective:     Patient ID: Tyler Andrews, male   DOB: [birth year], 63 y.o.   MRN: [id number]  HPI   Theoplis is seen for medical follow-up.  Type 2 diabetes.  This has been gradually improving.  He is currently on Basaglar insulin 70 units daily but because of insurance issues will no longer have coverage at the beginning of the year for his [place].  He remains on Metformin and Jardiance.  He also takes Humalog currently 10 units 3 times daily with meals.  His fasting blood sugars have been relatively stable but not checking postprandials very often.  Only one hypoglycemic episode during the past several months.  His blood pressures been very well controlled.  He remains on lisinopril.  He takes simvastatin for hyperlipidemia.  His lipids were checked in September and stable.  These are reviewed.  He is getting yearly eye exams.  Denies any neuropathy symptoms.  Past Medical History:  Diagnosis Date  . Chicken pox   . Depression   . Diabetes mellitus without complication ([place])   . Hypertension   . Urinary tract infection    Past Surgical History:  Procedure Laterality Date  . COLON SURGERY  02/13/2018   Dr. Celene Skeen in [place]  . KNEE SURGERY  10/2017   Right knee, tendon rupture    reports that he has never smoked. He has never used smokeless tobacco. He reports that he does not drink alcohol or use drugs. family history includes Cancer in his maternal grandfather; Diabetes in his father; Heart disease (age of onset: 46) in his father. Allergies  Allergen Reactions  . Trulicity [Dulaglutide] Nausea Only     Review of Systems  Constitutional: Negative for appetite change, fatigue and unexpected weight change.  Eyes: Negative for visual disturbance.  Respiratory: Negative for cough, chest tightness and shortness of breath.   Cardiovascular: Negative for chest pain, palpitations and leg swelling.  Endocrine: Negative for polydipsia and polyuria.  Genitourinary: Negative  for dysuria.  Neurological: Negative for dizziness, syncope, weakness, light-headedness and headaches.       Objective:   Physical Exam Vitals reviewed.  Constitutional:      Appearance: Normal appearance.  Cardiovascular:     Rate and Rhythm: Normal rate and regular rhythm.  Pulmonary:     Effort: Pulmonary effort is normal.     Breath sounds: Normal breath sounds.  Musculoskeletal:     Right lower leg: No edema.     Left lower leg: No edema.  Neurological:     Mental Status: He is alert.        Assessment:     #1 type 2 diabetes improving with A1c 7.6%  #2 hypertension stable and at goal  #3 hyperlipidemia controlled with simvastatin    Plan:     -Continue weight loss efforts -Patient will check with insurance to see if we can find alternatives to [employer] such as Lantus, Levemir, Toujeo, or Tresiba -We have asked that he check some 2-hour postprandial blood sugars and if consistently greater than 180 consider titrating Humalog at mealtime to 12 units -We will plan 47-month follow-up and consider complete physical at that point  Eulas Post MD [facility] at [facility]

## 2019-04-01 NOTE — Patient Instructions (Signed)
See if insurance will cover Levemir, Lantus, Toujeo, or Tyler Aas in place of the [place].  Check some 2 hour after meal blood sugars and if consistently > 180 consider bumping up the Humalog to 12 units.

## 2019-07-22 ENCOUNTER — Encounter: Payer: 59 | Admitting: Family Medicine

## 2019-08-12 ENCOUNTER — Encounter: Payer: Self-pay | Admitting: Family Medicine

## 2019-08-26 ENCOUNTER — Telehealth: Payer: Self-pay | Admitting: Family Medicine

## 2019-08-26 NOTE — Telephone Encounter (Signed)
Pt wife is going to send insurance card through [employer]

## 2019-08-26 NOTE — Telephone Encounter (Signed)
Pt wife call and want a call back about  pre-authorization for his insulin pen.Marland Kitchen

## 2019-08-26 NOTE — Telephone Encounter (Signed)
Humalog pen needs PA

## 2019-08-28 ENCOUNTER — Encounter: Payer: Self-pay | Admitting: Family Medicine

## 2019-08-28 NOTE — Telephone Encounter (Signed)
PA have been started

## 2019-08-28 NOTE — Telephone Encounter (Signed)
Key: [id number] on cover my meds

## 2019-09-04 ENCOUNTER — Other Ambulatory Visit: Payer: Self-pay

## 2019-09-04 ENCOUNTER — Ambulatory Visit (INDEPENDENT_AMBULATORY_CARE_PROVIDER_SITE_OTHER): Payer: Commercial Managed Care - PPO | Admitting: Family Medicine

## 2019-09-04 ENCOUNTER — Encounter: Payer: Self-pay | Admitting: Family Medicine

## 2019-09-04 VITALS — BP 136/64 | HR 84 | Temp 98.2°F | Ht 66.0 in | Wt 303.4 lb

## 2019-09-04 DIAGNOSIS — Z23 Encounter for immunization: Secondary | ICD-10-CM

## 2019-09-04 DIAGNOSIS — E11649 Type 2 diabetes mellitus with hypoglycemia without coma: Secondary | ICD-10-CM | POA: Diagnosis not present

## 2019-09-04 DIAGNOSIS — Z0000 Encounter for general adult medical examination without abnormal findings: Secondary | ICD-10-CM | POA: Diagnosis not present

## 2019-09-04 DIAGNOSIS — Z125 Encounter for screening for malignant neoplasm of prostate: Secondary | ICD-10-CM

## 2019-09-04 LAB — CBC WITH DIFFERENTIAL/PLATELET
Basophils Absolute: 0.1 10*3/uL (ref 0.0–0.1)
Basophils Relative: 0.5 % (ref 0.0–3.0)
Eosinophils Absolute: 0.2 10*3/uL (ref 0.0–0.7)
Eosinophils Relative: 2.1 % (ref 0.0–5.0)
HCT: 44.8 % (ref 39.0–52.0)
Hemoglobin: 14.7 g/dL (ref 13.0–17.0)
Lymphocytes Relative: 21.7 % (ref 12.0–46.0)
Lymphs Abs: 2.3 10*3/uL (ref 0.7–4.0)
MCHC: 32.8 g/dL (ref 30.0–36.0)
MCV: 89.8 fl (ref 78.0–100.0)
Monocytes Absolute: 0.9 10*3/uL (ref 0.1–1.0)
Monocytes Relative: 8.5 % (ref 3.0–12.0)
Neutro Abs: 7 10*3/uL (ref 1.4–7.7)
Neutrophils Relative %: 67.2 % (ref 43.0–77.0)
Platelets: 287 10*3/uL (ref 150.0–400.0)
RBC: 4.99 Mil/uL (ref 4.22–5.81)
RDW: 14.9 % (ref 11.5–15.5)
WBC: 10.5 10*3/uL (ref 4.0–10.5)

## 2019-09-04 LAB — LIPID PANEL
Cholesterol: 139 mg/dL (ref 0–200)
HDL: 41.5 mg/dL (ref >39.00)
LDL Cholesterol: 68 mg/dL (ref 0–99)
NonHDL: 97.97
Total CHOL/HDL Ratio: 3
Triglycerides: 148 mg/dL (ref 0.0–149.0)
VLDL: 29.6 mg/dL (ref 0.0–40.0)

## 2019-09-04 LAB — TSH: TSH: 2.48 u[IU]/mL (ref 0.35–4.50)

## 2019-09-04 LAB — HEPATIC FUNCTION PANEL
ALT: 15 U/L (ref 0–53)
AST: 12 U/L (ref 0–37)
Albumin: 4 g/dL (ref 3.5–5.2)
Alkaline Phosphatase: 62 U/L (ref 39–117)
Bilirubin, Direct: 0.1 mg/dL (ref 0.0–0.3)
Total Bilirubin: 0.4 mg/dL (ref 0.2–1.2)
Total Protein: 6.8 g/dL (ref 6.0–8.3)

## 2019-09-04 LAB — BASIC METABOLIC PANEL
BUN: 18 mg/dL (ref 6–23)
CO2: 31 mEq/L (ref 19–32)
Calcium: 9.2 mg/dL (ref 8.4–10.5)
Chloride: 103 mEq/L (ref 96–112)
Creatinine, Ser: 0.78 mg/dL (ref 0.40–1.50)
GFR: 99.87 mL/min (ref >60.00)
Glucose, Bld: 131 mg/dL — ABNORMAL HIGH (ref 70–99)
Potassium: 4.7 mEq/L (ref 3.5–5.1)
Sodium: 139 mEq/L (ref 135–145)

## 2019-09-04 LAB — PSA: PSA: 0.43 ng/mL (ref 0.10–4.00)

## 2019-09-04 LAB — HEMOGLOBIN A1C: Hgb A1c MFr Bld: 8 % — ABNORMAL HIGH (ref 4.6–6.5)

## 2019-09-04 MED ORDER — TOUJEO SOLOSTAR 300 UNIT/ML ~~LOC~~ SOPN: 70.0000 [IU] | PEN_INJECTOR | Freq: Every day | SUBCUTANEOUS | 3 refills | Status: DC

## 2019-09-04 NOTE — Patient Instructions (Signed)

## 2019-09-04 NOTE — Progress Notes (Signed)
Subjective:     Patient ID: Tyler Andrews, male   DOB: 23-Apr-1955, 64 y.o.   MRN: [id number]  HPI Tyler Andrews is here for physical exam and medical follow-up.  His history of morbid obesity, hypertension, obstructive sleep apnea, type 2 diabetes, history of depression, hyperlipidemia.  Last A1c was 7.6%.  He had challenges getting his insulin covered.  Currently on Basaglar 70 units daily and is requesting change to Toujeo.  He still has considerable cost with his medications.  He also remains on several other diabetes medications including Humalog insulin, Jardiance, Metformin.  He has had previous intolerance with Trulicity.  No recent hypoglycemia.  Health maintenance reviewed.  Colonoscopy due 2029, tetanus due 2028.  He has had Covid vaccine but does not have dates.  No history of hepatitis C screening.  Needs Pneumovax.  Has had previous Prevnar.  No history of shingles vaccine.  Past Medical History:  Diagnosis Date  . Chicken pox   . Depression   . Diabetes mellitus without complication ([place])   . Hypertension   . Urinary tract infection    Past Surgical History:  Procedure Laterality Date  . COLON SURGERY  02/13/2018   Dr. Celene Skeen in [place]  . KNEE SURGERY  10/2017   Right knee, tendon rupture    reports that he has never smoked. He has never used smokeless tobacco. He reports that he does not drink alcohol or use drugs. family history includes Cancer in his maternal grandfather; Diabetes in his father; Heart disease (age of onset: 22) in his father. Allergies  Allergen Reactions  . Trulicity [Dulaglutide] Nausea Only   Health Maintenance  Topic Date Due  . COVID-19 Vaccine (1) Never done  . HIV Screening  Never done  . HEMOGLOBIN A1C  09/30/2019  . INFLUENZA VACCINE  11/03/2019  . FOOT EXAM  12/31/2019  . OPHTHALMOLOGY EXAM  05/14/2020  . TETANUS/TDAP  02/23/2027  . COLONOSCOPY  02/14/2028  . Hepatitis C Screening  Completed  . PNA vac Low Risk Adult  Completed      Review of Systems  Constitutional: Negative for activity change, appetite change, fatigue and fever.  HENT: Negative for congestion, ear pain and trouble swallowing.   Eyes: Negative for pain and visual disturbance.  Respiratory: Negative for cough, shortness of breath and wheezing.   Cardiovascular: Negative for chest pain and palpitations.  Gastrointestinal: Negative for abdominal distention, abdominal pain, blood in stool, constipation, diarrhea, nausea, rectal pain and vomiting.  Endocrine: Negative for polydipsia and polyuria.  Genitourinary: Negative for dysuria, hematuria and testicular pain.  Musculoskeletal: Negative for arthralgias and joint swelling.  Skin: Negative for rash.  Neurological: Negative for dizziness, syncope and headaches.  Hematological: Negative for adenopathy.  Psychiatric/Behavioral: Negative for confusion and dysphoric mood.       Objective:   Physical Exam Vitals reviewed.  Constitutional:      General: He is not in acute distress.    Appearance: He is well-developed.  HENT:     Head: Normocephalic and atraumatic.     Right Ear: External ear normal.     Left Ear: External ear normal.  Eyes:     Conjunctiva/sclera: Conjunctivae normal.     Pupils: Pupils are equal, round, and reactive to light.  Neck:     Thyroid: No thyromegaly.  Cardiovascular:     Rate and Rhythm: Normal rate and regular rhythm.     Heart sounds: Normal heart sounds. No murmur.  Pulmonary:     Effort:  No respiratory distress.     Breath sounds: No wheezing or rales.  Abdominal:     General: Bowel sounds are normal. There is no distension.     Palpations: Abdomen is soft.     Tenderness: There is no abdominal tenderness. There is no guarding or rebound.     Hernia: A hernia is present.     Comments: He has umbilical and ventral hernias which are soft and nontender  Musculoskeletal:     Cervical back: Normal range of motion and neck supple.     Comments: He has  significant varicosities in his legs.  No pitting edema.  Lymphadenopathy:     Cervical: No cervical adenopathy.  Skin:    Findings: No rash.  Neurological:     Mental Status: He is alert and oriented to person, place, and time.     Cranial Nerves: No cranial nerve deficit.     Deep Tendon Reflexes: Reflexes normal.        Assessment:     #1 physical exam.  Patient has multiple medical problems as listed above.  We discussed several health maintenance issues as below  #2 type 2 diabetes.  History of suboptimal control.  He is requesting change from [employer] to [employer] for insurance purposes    Plan:     -Discontinue Basaglar insulin and start Toujeo with new prescription sent. -We strongly advise weight loss.  He has seen nutritionist previously -We discussed shingles vaccine and he will check on insurance coverage -Pneumovax given -Continue with annual flu vaccine -Check hepatitis C antibody -The natural history of prostate cancer and ongoing controversy regarding screening and potential treatment outcomes of prostate cancer has been discussed with the patient. The meaning of a false positive PSA and a false negative PSA has been discussed. He indicates understanding of the limitations of this screening test and wishes to proceed with screening PSA testing. -Check other labs with lipid, hepatic, basic metabolic panel, TSH, CBC, A1c -Continue with annual diabetic eye exams -Confirm dates of Covid vaccination for our records -Colonoscopy up-to-date -Schedule follow-up in 3 months to reassess after change of insulin above  Tyler Post MD [facility] '

## 2019-09-05 LAB — HEPATITIS C ANTIBODY
Hepatitis C Ab: NONREACTIVE
SIGNAL TO CUT-OFF: 0.01 (ref <1.00)

## 2019-09-09 ENCOUNTER — Telehealth: Payer: Self-pay | Admitting: Family Medicine

## 2019-09-09 NOTE — Telephone Encounter (Signed)
Pt was returning Maddi's call about results. Pt hung up-needs a call back at [phone]

## 2019-09-09 NOTE — Telephone Encounter (Signed)
See result notes. 

## 2019-09-10 ENCOUNTER — Other Ambulatory Visit: Payer: Self-pay

## 2019-09-10 MED ORDER — SITAGLIPTIN PHOSPHATE 100 MG PO TABS
100.0000 mg | ORAL_TABLET | Freq: Every day | ORAL | 3 refills | Status: DC
Start: 2019-09-10 — End: 2020-01-21

## 2019-09-10 NOTE — Telephone Encounter (Signed)
Pt returning call,. Needs call back at [phone]

## 2019-09-10 NOTE — Telephone Encounter (Signed)
See result note.  

## 2019-09-19 ENCOUNTER — Encounter: Payer: Self-pay | Admitting: Family Medicine

## 2019-09-19 ENCOUNTER — Telehealth: Payer: Self-pay | Admitting: Family Medicine

## 2019-09-19 NOTE — Telephone Encounter (Signed)
Pt having back spasm and would like to know if dr burchette can call in something to help. Pt was seen for CPE on 09/04/2019.

## 2019-09-20 ENCOUNTER — Other Ambulatory Visit: Payer: Self-pay | Admitting: Family Medicine

## 2019-09-20 MED ORDER — TIZANIDINE HCL 4 MG PO CAPS
4.0000 mg | ORAL_CAPSULE | Freq: Two times a day (BID) | ORAL | 0 refills | Status: DC | PRN
Start: 2019-09-20 — End: 2020-01-21

## 2019-09-20 NOTE — Telephone Encounter (Signed)
I sent in Zanaflex for him.  It looks like he has had Flexeril in the past, but Zanaflex is somewhat safer.  Would be reasonable to schedule follow-up with Dr. Elease Hashimoto to make sure improving.

## 2019-09-20 NOTE — Telephone Encounter (Signed)
Please advise while Dr.Burchette is out of town

## 2019-09-20 NOTE — Telephone Encounter (Signed)
Pt has been informed that medication has been sent in and states will follow up if not improving

## 2019-09-23 ENCOUNTER — Encounter: Payer: Self-pay | Admitting: Family Medicine

## 2019-09-23 MED ORDER — TOUJEO SOLOSTAR 300 UNIT/ML ~~LOC~~ SOPN: 70.0000 [IU] | PEN_INJECTOR | Freq: Every day | SUBCUTANEOUS | 3 refills | Status: DC

## 2019-10-15 ENCOUNTER — Ambulatory Visit: Payer: Commercial Managed Care - PPO | Admitting: Family Medicine

## 2019-10-16 ENCOUNTER — Encounter: Payer: Self-pay | Admitting: Family Medicine

## 2019-10-16 ENCOUNTER — Ambulatory Visit (INDEPENDENT_AMBULATORY_CARE_PROVIDER_SITE_OTHER): Payer: Commercial Managed Care - PPO | Admitting: Family Medicine

## 2019-10-16 ENCOUNTER — Other Ambulatory Visit: Payer: Self-pay

## 2019-10-16 VITALS — BP 134/68 | HR 105 | Temp 98.2°F | Wt 302.5 lb

## 2019-10-16 DIAGNOSIS — E11649 Type 2 diabetes mellitus with hypoglycemia without coma: Secondary | ICD-10-CM | POA: Diagnosis not present

## 2019-10-16 DIAGNOSIS — F418 Other specified anxiety disorders: Secondary | ICD-10-CM | POA: Diagnosis not present

## 2019-10-16 MED ORDER — LORAZEPAM 0.5 MG PO TABS
0.5000 mg | ORAL_TABLET | Freq: Three times a day (TID) | ORAL | 0 refills | Status: DC | PRN
Start: 2019-10-16 — End: 2020-01-21

## 2019-10-16 NOTE — Progress Notes (Signed)
Established Patient Office Visit  Subjective:  Patient ID: Tyler Andrews, male    DOB: [birth year]  Age: 64 y.o. MRN: [id number]  CC:  Chief Complaint  Patient presents with  . Anxiety    pt states to due to stress and wants to discuss treatment    HPI Tyler Andrews presents for medical follow-up.  His specific concern predominantly is increased stressors recently.  He has been on Paxil for several years and remains on 20 mg/day.  His father is currently hospitalized in may be nearing death.  His father has multiple medical problems including chronic kidney disease and had recent left leg amputation and has gangrene in the other leg.  [name] had recommended palliative care but he has 2 sisters that are arguing for aggressive treatment and that has caused a lot of friction.  That is a big source of his stress.  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  At this time though his workload has been extremely heavy.  He has anxiousness and has difficulty sleeping frequently.  He has reported panic disorder and for years has been on Paxil.  For some time he was taking Xanax but we had recommended he taper off that.  He eventually did.  Type 2 diabetes.  Recently switched from [employer] to [employer].  That seems to be working well.  Recent A1c 8.0%.  We recommended addition of Januvia to his multiple medications but he never got this prescription filled secondary to cost  Past Medical History:  Diagnosis Date  . Chicken pox   . Depression   . Diabetes mellitus without complication ([place])   . Hypertension   . Urinary tract infection     Past Surgical History:  Procedure Laterality Date  . COLON SURGERY  02/13/2018   Dr. Celene Skeen in [place]  . KNEE SURGERY  10/2017   Right knee, tendon rupture    Family History  Problem Relation Age of Onset  . Diabetes Father   . Heart disease Father 22       CAD  . Cancer Maternal Grandfather         prostate    Social History   Socioeconomic History  . Marital status: Divorced    Spouse name: Not on file  . Number of children: Not on file  . Years of education: Not on file  . Highest education level: Not on file  Occupational History  . Not on file  Tobacco Use  . Smoking status: Never Smoker  . Smokeless tobacco: Never Used  Vaping Use  . Vaping Use: Never used  Substance and Sexual Activity  . Alcohol use: No  . Drug use: No  . Sexual activity: Not on file  Other Topics Concern  . Not on file  Social History Narrative  . Not on file   Social Determinants of Health   Financial Resource Strain:   . Difficulty of Paying Living Expenses:   Food Insecurity:   . Worried About Charity fundraiser in the Last Year:   . Arboriculturist in the Last Year:   Transportation Needs:   . Film/video editor (Medical):   Marland Kitchen Lack of Transportation (Non-Medical):   Physical Activity:   . Days of Exercise per Week:   . Minutes of Exercise per Session:   Stress:   . Feeling of Stress :   Social Connections:   .  Frequency of Communication with Friends and Family:   . Frequency of Social Gatherings with Friends and Family:   . Attends Religious Services:   . Active Member of Clubs or Organizations:   . Attends Archivist Meetings:   Marland Kitchen Marital Status:   Intimate Partner Violence:   . Fear of Current or Ex-Partner:   . Emotionally Abused:   Marland Kitchen Physically Abused:   . Sexually Abused:     Outpatient Medications Prior to Visit  Medication Sig Dispense Refill  . acyclovir (ZOVIRAX) 800 MG tablet Take 1 tablet by mouth twice daily 60 tablet 0  . aspirin 81 MG tablet Take 81 mg by mouth daily.    . empagliflozin (JARDIANCE) 10 MG TABS tablet Take 10 mg by mouth daily before breakfast. 90 tablet 3  . insulin glargine, 1 Unit Dial, (TOUJEO SOLOSTAR) 300 UNIT/ML Solostar Pen Inject 70 Units into the skin daily. 6 pen 3  . insulin lispro (HUMALOG KWIKPEN) 100  UNIT/ML KwikPen Inject 10 units subcutaneously tid with meals. 21 mL 3  . lisinopril (ZESTRIL) 10 MG tablet Take 1 tablet (10 mg total) by mouth daily. 90 tablet 3  . metFORMIN (GLUCOPHAGE) 500 MG tablet TAKE 2 TABLETS BY MOUTH TWICE DAILY . APPOINTMENT REQUIRED FOR FUTURE REFILLS 360 tablet 3  . Misc. Devices MISC Initiate Auto C-pap 5-15 cm with mask and supplies    . PARoxetine (PAXIL) 20 MG tablet Take 1 tablet (20 mg total) by mouth daily. 90 tablet 3  . simvastatin (ZOCOR) 40 MG tablet Take 1 tablet (40 mg total) by mouth at bedtime. 90 tablet 3  . sitaGLIPtin (JANUVIA) 100 MG tablet Take 1 tablet (100 mg total) by mouth daily. 90 tablet 3  . tiZANidine (ZANAFLEX) 4 MG capsule Take 1 capsule (4 mg total) by mouth 2 (two) times daily as needed for muscle spasms. 30 capsule 0   No facility-administered medications prior to visit.    Allergies  Allergen Reactions  . Trulicity [Dulaglutide] Nausea Only    ROS Review of Systems  Constitutional: Negative for fatigue.  Eyes: Negative for visual disturbance.  Respiratory: Negative for cough, chest tightness and shortness of breath.   Cardiovascular: Negative for chest pain, palpitations and leg swelling.  Neurological: Negative for dizziness, syncope, weakness, light-headedness and headaches.  Psychiatric/Behavioral: Negative for agitation and dysphoric mood. The patient is nervous/anxious.       Objective:    Physical Exam Vitals reviewed.  Constitutional:      General: He is not in acute distress.    Appearance: He is obese. He is not ill-appearing.  Cardiovascular:     Rate and Rhythm: Regular rhythm.  Pulmonary:     Effort: Pulmonary effort is normal.     Breath sounds: Normal breath sounds.  Musculoskeletal:     Cervical back: Neck supple.  Neurological:     General: No focal deficit present.     Mental Status: He is alert.     BP 134/68 (BP Location: Left Arm, Patient Position: Sitting, Cuff Size: Normal)   Pulse  (!) 105   Temp 98.2 F (36.8 C) (Oral)   Wt (!) 302 lb 8 oz (137.2 kg)   SpO2 95%   BMI 48.82 kg/m  Wt Readings from Last 3 Encounters:  10/16/19 (!) 302 lb 8 oz (137.2 kg)  09/04/19 (!) 303 lb 6.4 oz (137.6 kg)  04/01/19 (!) 303 lb 6.4 oz (137.6 kg)     Health Maintenance Due  Topic Date  Due  . COVID-19 Vaccine (1) Never done  . HIV Screening  Never done    There are no preventive care reminders to display for this patient.  Lab Results  Component Value Date   TSH 2.48 09/04/2019   Lab Results  Component Value Date   WBC 10.5 09/04/2019   HGB 14.7 09/04/2019   HCT 44.8 09/04/2019   MCV 89.8 09/04/2019   PLT 287.0 09/04/2019   Lab Results  Component Value Date   NA 139 09/04/2019   K 4.7 09/04/2019   CO2 31 09/04/2019   GLUCOSE 131 (H) 09/04/2019   BUN 18 09/04/2019   CREATININE 0.78 09/04/2019   BILITOT 0.4 09/04/2019   ALKPHOS 62 09/04/2019   AST 12 09/04/2019   ALT 15 09/04/2019   PROT 6.8 09/04/2019   ALBUMIN 4.0 09/04/2019   CALCIUM 9.2 09/04/2019   GFR 99.87 09/04/2019   Lab Results  Component Value Date   CHOL 139 09/04/2019   Lab Results  Component Value Date   HDL 41.50 09/04/2019   Lab Results  Component Value Date   LDLCALC 68 09/04/2019   Lab Results  Component Value Date   TRIG 148.0 09/04/2019   Lab Results  Component Value Date   CHOLHDL 3 09/04/2019   Lab Results  Component Value Date   HGBA1C 8.0 (H) 09/04/2019      Assessment & Plan:   #1 multiple situational stressors particularly involving family and work. -Continue Paxil 20 mg daily -Patient requesting treatment for acute severe anxiety and to help short-term with severe anxiety inhibiting sleep.  We have recommended against regular use of benzodiazepines.  We agreed to limited lorazepam 0.5 mg 1 nightly and every 8 hours as needed for severe anxiety symptoms #30 with no refills. -Consider counseling -Discussed other ways of managing anxiety including exercise,  hobbies, etc.  #2 type 2 diabetes.  History of poor control.  Recent A1c 8.0%.  He had difficulty getting medications filled -We will see if we can get him set up with our clinical pharmacist to look at medication assistance programs -We recommend a 45-month follow-up to recheck his A1c  #3 morbid obesity.  We have recommended for some time weight loss but has not had much success.  Meds ordered this encounter  Medications  . LORazepam (ATIVAN) 0.5 MG tablet    Sig: Take 1 tablet (0.5 mg total) by mouth every 8 (eight) hours as needed for anxiety.    Dispense:  30 tablet    Refill:  0    Follow-up: No follow-ups on file.    Carolann Littler, MD

## 2019-10-16 NOTE — Patient Instructions (Signed)
Managing Stress, Adult Feeling a certain amount of stress is normal. Stress helps our body and mind get ready to deal with the demands of life. Stress hormones can motivate you to do well at work and meet your responsibilities. However severe or long-lasting (chronic) stress can affect your mental and physical health. Chronic stress puts you at higher risk for anxiety, depression, and other health problems like digestive problems, muscle aches, heart disease, high blood pressure, and stroke. What are the causes? Common causes of stress include:  Demands from work, such as deadlines, feeling overworked, or having long hours.  Pressures at home, such as money issues, disagreements with a spouse, or parenting issues.  Pressures from major life changes, such as divorce, moving, loss of a loved one, or chronic illness. You may be at higher risk for stress-related problems if you do not get enough sleep, are in poor health, do not have emotional support, or have a mental health disorder like anxiety or depression. How to recognize stress Stress can make you:  Have trouble sleeping.  Feel sad, anxious, irritable, or overwhelmed.  Lose your appetite.  Overeat or want to eat unhealthy foods.  Want to use drugs or alcohol. Stress can also cause physical symptoms, such as:  Sore, tense muscles, especially in the shoulders and neck.  Headaches.  Trouble breathing.  A faster heart rate.  Stomach pain, nausea, or vomiting.  Diarrhea or constipation.  Trouble concentrating. Follow these instructions at home: Lifestyle  Identify the source of your stress and your reaction to it. See a therapist who can help you change your reactions.  When there are stressful events: ? Talk about it with family, friends, or co-workers. ? Try to think realistically about stressful events and not ignore them or overreact. ? Try to find the positives in a stressful situation and not focus on the  negatives. ? Cut back on responsibilities at work and home, if possible. Ask for help from friends or family members if you need it.  Find ways to cope with stress, such as: ? Meditation. ? Deep breathing. ? Yoga or tai chi. ? Progressive muscle relaxation. ? Doing art, playing music, or reading. ? Making time for fun activities. ? Spending time with family and friends.  Get support from family, friends, or spiritual resources. Eating and drinking  Eat a healthy diet. This includes: ? Eating foods that are high in fiber, such as beans, whole grains, and fresh fruits and vegetables. ? Limiting foods that are high in fat and processed sugars, such as fried and sweet foods.  Do not skip meals or overeat.  Drink enough fluid to keep your urine pale yellow. Alcohol use  Do not drink alcohol if: ? Your health care provider tells you not to drink. ? You are pregnant, may be pregnant, or are planning to become pregnant.  Drinking alcohol is a way some people try to ease their stress. This can be dangerous, so if you drink alcohol: ? Limit how much you use to:  0-1 drink a day for women.  0-2 drinks a day for men. ? Be aware of how much alcohol is in your drink. In the [place] one drink equals one 12 oz bottle of beer (355 mL), one 5 oz glass of wine (148 mL), or one 1 oz glass of hard liquor (44 mL). Activity   Include 30 minutes of exercise in your daily schedule. Exercise is a good stress reducer.  Include time in your day   for an activity that you find relaxing. Try taking a walk, going on a bike ride, reading a book, or listening to music.  Schedule your time in a way that lowers stress, and keep a consistent schedule. Prioritize what is most important to get done. General instructions  Get enough sleep. Try to go to sleep and get up at about the same time every day.  Take over-the-counter and prescription medicines only as told by your health care provider.  Do not use any  products that contain nicotine or tobacco, such as cigarettes, e-cigarettes, and chewing tobacco. If you need help quitting, ask your health care provider.  Do not use drugs or smoke to cope with stress.  Keep all follow-up visits as told by your health care provider. This is important. Where to find support  Talk with your health care provider about stress management or finding a support group.  Find a therapist to work with you on your stress management techniques. Contact a health care provider if:  Your stress symptoms get worse.  You are unable to manage your stress at home.  You are struggling to stop using drugs or alcohol. Get help right away if:  You may be a danger to yourself or others.  You have any thoughts of death or suicide. If you ever feel like you may hurt yourself or others, or have thoughts about taking your own life, get help right away. You can go to your nearest emergency department or call:  Your local emergency services (911 in the [place]  A suicide crisis helpline, such as the [place] at 786-499-0900. This is open 24 hours a day. Summary  Feeling a certain amount of stress is normal, but severe or long-lasting (chronic) stress can affect your mental and physical health.  Chronic stress can put you at higher risk for anxiety, depression, and other health problems like digestive problems, muscle aches, heart disease, high blood pressure, and stroke.  You may be at higher risk for stress-related problems if you do not get enough sleep, are in poor health, lack emotional support, or have a mental health disorder like anxiety or depression.  Identify the source of your stress and your reaction to it. Try talking about stressful events with family, friends, or co-workers, finding a coping method, or getting support from spiritual resources.  If you need more help, talk with your health care provider about finding a support group  or a mental health therapist. This information is not intended to replace advice given to you by your health care provider. Make sure you discuss any questions you have with your health care provider. Document Revised: 10/17/2018 Document Reviewed: 10/17/2018 Elsevier Patient Education  [place].

## 2019-10-17 ENCOUNTER — Telehealth: Payer: Self-pay

## 2019-10-17 NOTE — Telephone Encounter (Addendum)
Contacted patient to schedule visit with pharmacy to address medication concerns per Dr. Elease Hashimoto request.   Visit scheduled 10/21/19 at 4pm.      ----- Message from Eulas Post, MD sent at 10/17/2019  1:33 PM EDT ----- Thanks.  He does have a hx of poor compliance with diet and exercise ,which no doubt is effecting his poor control, but he also is relating cost issues with poor medication compliance.   ----- Message ----- From: Earnie Larsson, [facility] Sent: 10/17/2019   9:52 AM EDT To: Eulas Post, MD  Hi Dr. Elease Hashimoto,   That is no problem. I will reach out to him to schedule a time to review medications and address concerns.   Anne Ng  ----- Message ----- From: Eulas Post, MD Sent: 10/16/2019   4:57 PM EDT To: Earnie Larsson, [place],  Would it be possible to consult with this gentleman about medications?   He has had poorly controlled type 2 diabetes for some time.  We recently added Januvia but he never got a prescription filled because of cost.  Any assistance would be greatly appreciated!    Tyler Andrews

## 2019-10-21 ENCOUNTER — Ambulatory Visit: Payer: Commercial Managed Care - PPO

## 2019-12-06 ENCOUNTER — Other Ambulatory Visit: Payer: Self-pay | Admitting: Family Medicine

## 2019-12-15 ENCOUNTER — Other Ambulatory Visit: Payer: Self-pay | Admitting: Family Medicine

## 2020-01-05 ENCOUNTER — Other Ambulatory Visit: Payer: Self-pay | Admitting: Family Medicine

## 2020-01-06 ENCOUNTER — Other Ambulatory Visit: Payer: Self-pay

## 2020-01-06 MED ORDER — METFORMIN HCL 500 MG PO TABS: 1000.0000 mg | ORAL_TABLET | Freq: Two times a day (BID) | ORAL | 0 refills | Status: DC

## 2020-01-11 ENCOUNTER — Other Ambulatory Visit: Payer: Self-pay | Admitting: Family Medicine

## 2020-01-21 ENCOUNTER — Ambulatory Visit (INDEPENDENT_AMBULATORY_CARE_PROVIDER_SITE_OTHER): Payer: Commercial Managed Care - PPO | Admitting: Family Medicine

## 2020-01-21 ENCOUNTER — Other Ambulatory Visit: Payer: Self-pay

## 2020-01-21 VITALS — BP 138/68 | HR 84 | Ht 66.0 in | Wt 311.8 lb

## 2020-01-21 DIAGNOSIS — E785 Hyperlipidemia, unspecified: Secondary | ICD-10-CM

## 2020-01-21 DIAGNOSIS — E11649 Type 2 diabetes mellitus with hypoglycemia without coma: Secondary | ICD-10-CM | POA: Diagnosis not present

## 2020-01-21 DIAGNOSIS — Z23 Encounter for immunization: Secondary | ICD-10-CM

## 2020-01-21 DIAGNOSIS — I10 Essential (primary) hypertension: Secondary | ICD-10-CM

## 2020-01-21 DIAGNOSIS — K429 Umbilical hernia without obstruction or gangrene: Secondary | ICD-10-CM

## 2020-01-21 LAB — POCT GLYCOSYLATED HEMOGLOBIN (HGB A1C)
HbA1c POC (<> result, manual entry): 7.5 % (ref 4.0–5.6)
HbA1c, POC (controlled diabetic range): 7.5 % — AB (ref 0.0–7.0)
HbA1c, POC (prediabetic range): 7.5 % — AB (ref 5.7–6.4)
Hemoglobin A1C: 7.5 % — AB (ref 4.0–5.6)

## 2020-01-21 MED ORDER — LORAZEPAM 0.5 MG PO TABS: 0.5000 mg | ORAL_TABLET | Freq: Three times a day (TID) | ORAL | 0 refills | Status: DC | PRN

## 2020-01-21 MED ORDER — PAROXETINE HCL 20 MG PO TABS
20.0000 mg | ORAL_TABLET | Freq: Every day | ORAL | 3 refills | Status: DC
Start: 2020-01-21 — End: 2020-06-19

## 2020-01-21 MED ORDER — EMPAGLIFLOZIN 10 MG PO TABS: 10.0000 mg | ORAL_TABLET | Freq: Every day | ORAL | 3 refills | Status: DC

## 2020-01-21 MED ORDER — LISINOPRIL 10 MG PO TABS
10.0000 mg | ORAL_TABLET | Freq: Every day | ORAL | 3 refills | Status: DC
Start: 2020-01-21 — End: 2020-06-19

## 2020-01-21 MED ORDER — SIMVASTATIN 40 MG PO TABS
40.0000 mg | ORAL_TABLET | Freq: Every day | ORAL | 3 refills | Status: DC
Start: 2020-01-21 — End: 2020-02-26

## 2020-01-21 MED ORDER — TOUJEO SOLOSTAR 300 UNIT/ML ~~LOC~~ SOPN
70.0000 [IU] | PEN_INJECTOR | Freq: Every day | SUBCUTANEOUS | 11 refills | Status: DC
Start: 2020-01-21 — End: 2020-07-03

## 2020-01-21 MED ORDER — METFORMIN HCL 500 MG PO TABS
1000.0000 mg | ORAL_TABLET | Freq: Two times a day (BID) | ORAL | 3 refills | Status: DC
Start: 2020-01-21 — End: 2021-02-17

## 2020-01-21 MED ORDER — INSULIN LISPRO (1 UNIT DIAL) 100 UNIT/ML (KWIKPEN)
PEN_INJECTOR | SUBCUTANEOUS | 3 refills | Status: DC
Start: 2020-01-21 — End: 2020-06-19

## 2020-01-21 NOTE — Progress Notes (Signed)
Established Patient Office Visit  Subjective:  Patient ID: Tyler Andrews, male    DOB: [birth year]  Age: 64 y.o. MRN: [id number]  CC:  Chief Complaint  Patient presents with  . Diabetes    discuss skin tags , dm medication , hernia abd  has gotten hard     HPI Tyler Andrews presents for medical follow-up.  He is preparing to retire in December.  He will go on Medicare after that.  He lives up in [place] and will likely be looking for a primary care provider up closer to that direction then.  He has been seen here for several years because of close proximity to work.  His chronic problems include history of morbid obesity, hypertension, obstructive sleep apnea, type 2 diabetes, history of recurrent depression, hyperlipidemia  Basically needs refills of all medications today including Metformin, lisinopril, Jardiance, Paxil, simvastatin, and his insulins.  He did recently run out of [place].  He had been taking 70 units daily and reduce this to 40 units.  His weight is up a few pounds from last visit.  His last A1c was 8.0%.  He has skin lesion left side of face.  Sometimes irritated when brushing over this.  He has longstanding history of umbilical and abdominal wall hernia.  He felt like the hernia just above his umbilicus recently felt more firm but nonpainful.  He has been seen by a surgeon previously but no surgery recommended.    Past Medical History:  Diagnosis Date  . Chicken pox   . Depression   . Diabetes mellitus without complication ([place])   . Hypertension   . Urinary tract infection     Past Surgical History:  Procedure Laterality Date  . COLON SURGERY  02/13/2018   Dr. Celene Skeen in [place]  . KNEE SURGERY  10/2017   Right knee, tendon rupture    Family History  Problem Relation Age of Onset  . Diabetes Father   . Heart disease Father 75       CAD  . Cancer Maternal Grandfather        prostate    Social History   Socioeconomic History  .  Marital status: Divorced    Spouse name: Not on file  . Number of children: Not on file  . Years of education: Not on file  . Highest education level: Not on file  Occupational History  . Not on file  Tobacco Use  . Smoking status: Never Smoker  . Smokeless tobacco: Never Used  Vaping Use  . Vaping Use: Never used  Substance and Sexual Activity  . Alcohol use: No  . Drug use: No  . Sexual activity: Not on file  Other Topics Concern  . Not on file  Social History Narrative  . Not on file   Social Determinants of Health   Financial Resource Strain:   . Difficulty of Paying Living Expenses: Not on file  Food Insecurity:   . Worried About Charity fundraiser in the Last Year: Not on file  . Ran Out of Food in the Last Year: Not on file  Transportation Needs:   . Lack of Transportation (Medical): Not on file  . Lack of Transportation (Non-Medical): Not on file  Physical Activity:   . Days of Exercise per Week: Not on file  . Minutes of Exercise per Session: Not on file  Stress:   . Feeling of Stress : Not on file  Social Connections:   .  Frequency of Communication with Friends and Family: Not on file  . Frequency of Social Gatherings with Friends and Family: Not on file  . Attends Religious Services: Not on file  . Active Member of Clubs or Organizations: Not on file  . Attends Archivist Meetings: Not on file  . Marital Status: Not on file  Intimate Partner Violence:   . Fear of Current or Ex-Partner: Not on file  . Emotionally Abused: Not on file  . Physically Abused: Not on file  . Sexually Abused: Not on file    Outpatient Medications Prior to Visit  Medication Sig Dispense Refill  . acyclovir (ZOVIRAX) 800 MG tablet Take 1 tablet by mouth twice daily 60 tablet 0  . aspirin 81 MG tablet Take 81 mg by mouth daily.    . Misc. Devices MISC Initiate Auto C-pap 5-15 cm with mask and supplies    . empagliflozin (JARDIANCE) 10 MG TABS tablet Take 10 mg by  mouth daily before breakfast. 90 tablet 3  . insulin glargine, 1 Unit Dial, (TOUJEO SOLOSTAR) 300 UNIT/ML Solostar Pen Inject 70 Units into the skin daily. 6 pen 3  . insulin lispro (HUMALOG) 100 UNIT/ML KwikPen INJECT 10 UNITS SUBCUTANEOUSLY THREE TIMES DAILY WITH MEALS 15 mL 0  . lisinopril (ZESTRIL) 10 MG tablet Take 1 tablet by mouth once daily 90 tablet 0  . LORazepam (ATIVAN) 0.5 MG tablet Take 1 tablet (0.5 mg total) by mouth every 8 (eight) hours as needed for anxiety. 30 tablet 0  . metFORMIN (GLUCOPHAGE) 500 MG tablet Take 2 tablets (1,000 mg total) by mouth 2 (two) times daily with a meal. Needs appointment for refills. 360 tablet 0  . PARoxetine (PAXIL) 20 MG tablet Take 1 tablet (20 mg total) by mouth daily. 90 tablet 3  . simvastatin (ZOCOR) 40 MG tablet Take 1 tablet (40 mg total) by mouth at bedtime. 90 tablet 3  . tiZANidine (ZANAFLEX) 4 MG capsule Take 1 capsule (4 mg total) by mouth 2 (two) times daily as needed for muscle spasms. 30 capsule 0  . sitaGLIPtin (JANUVIA) 100 MG tablet Take 1 tablet (100 mg total) by mouth daily. (Patient not taking: Reported on 01/21/2020) 90 tablet 3   No facility-administered medications prior to visit.    Allergies  Allergen Reactions  . Trulicity [Dulaglutide] Nausea Only    ROS Review of Systems  Constitutional: Negative for fatigue and unexpected weight change.  Eyes: Negative for visual disturbance.  Respiratory: Negative for cough, chest tightness and shortness of breath.   Cardiovascular: Negative for chest pain, palpitations and leg swelling.  Endocrine: Negative for polydipsia and polyuria.  Neurological: Negative for dizziness, syncope, weakness, light-headedness and headaches.      Objective:    Physical Exam Vitals reviewed.  Cardiovascular:     Rate and Rhythm: Normal rate and regular rhythm.  Pulmonary:     Effort: Pulmonary effort is normal.     Breath sounds: Normal breath sounds.  Abdominal:     Comments:  Abdomen is protuberant.  Normal bowel sounds.  He has small umbilical hernia which is soft and nontender and easily reducible.  He has what appears to be a hernia mass superior to this which is slightly more firm but nontender.  Skin:    Comments: Well-demarcated scaly brownish 5 to 6 mm lesion left temporal region consistent with seborrheic keratosis  Neurological:     Mental Status: He is alert.     BP 138/68 (BP Location: Left  Arm, Patient Position: Sitting)   Pulse 84   Ht 5\' 6"  (1.676 m)   Wt (!) 311 lb 12.8 oz (141.4 kg)   SpO2 97%   BMI 50.33 kg/m  Wt Readings from Last 3 Encounters:  01/21/20 (!) 311 lb 12.8 oz (141.4 kg)  10/16/19 (!) 302 lb 8 oz (137.2 kg)  09/04/19 (!) 303 lb 6.4 oz (137.6 kg)     Health Maintenance Due  Topic Date Due  . COVID-19 Vaccine (1) Never done  . HIV Screening  Never done  . FOOT EXAM  12/31/2019    There are no preventive care reminders to display for this patient.  Lab Results  Component Value Date   TSH 2.48 09/04/2019   Lab Results  Component Value Date   WBC 10.5 09/04/2019   HGB 14.7 09/04/2019   HCT 44.8 09/04/2019   MCV 89.8 09/04/2019   PLT 287.0 09/04/2019   Lab Results  Component Value Date   NA 139 09/04/2019   K 4.7 09/04/2019   CO2 31 09/04/2019   GLUCOSE 131 (H) 09/04/2019   BUN 18 09/04/2019   CREATININE 0.78 09/04/2019   BILITOT 0.4 09/04/2019   ALKPHOS 62 09/04/2019   AST 12 09/04/2019   ALT 15 09/04/2019   PROT 6.8 09/04/2019   ALBUMIN 4.0 09/04/2019   CALCIUM 9.2 09/04/2019   GFR 99.87 09/04/2019   Lab Results  Component Value Date   CHOL 139 09/04/2019   Lab Results  Component Value Date   HDL 41.50 09/04/2019   Lab Results  Component Value Date   LDLCALC 68 09/04/2019   Lab Results  Component Value Date   TRIG 148.0 09/04/2019   Lab Results  Component Value Date   CHOLHDL 3 09/04/2019   Lab Results  Component Value Date   HGBA1C 7.5 (A) 01/21/2020   HGBA1C 7.5 01/21/2020    HGBA1C 7.5 (A) 01/21/2020   HGBA1C 7.5 (A) 01/21/2020      Assessment & Plan:   #1 type 2 diabetes.  A1c slightly improved to 7.5%.  Patient recently ran low on his Toujeo insulin had to scale back back.  -We discussed options again none of additional medication noted this point he would like to get refills of his insulin and see if he can get this down to goal of less than 7 over the next few months we have he is intolerance with GLP-1 medication with Trulicity. -Refilled his other diabetic medications.  We had Januvia on his med list but he apparently is not been taking this.  #2 hypertension stable -Refill lisinopril  #3 dyslipidemia.  Had recent lipids in June which were stable -Refill simvastatin for 1 year  #4 benign-appearing seborrheic keratosis left side of face -Reassurance  #5 history of recurrent depression -Patient has been reluctant to switch from Paxil.  We expressed our concerns regarding weight gain with this drug but he has done well with this for his depression for years.  This is refilled.  #6 umbilical and abdominal wall hernia.  No evidence for strangulation -Referral back to general surgery if these are becoming more symptomatic    Meds ordered this encounter  Medications  . simvastatin (ZOCOR) 40 MG tablet    Sig: Take 1 tablet (40 mg total) by mouth at bedtime.    Dispense:  90 tablet    Refill:  3  . PARoxetine (PAXIL) 20 MG tablet    Sig: Take 1 tablet (20 mg total) by mouth daily.  Dispense:  90 tablet    Refill:  3  . metFORMIN (GLUCOPHAGE) 500 MG tablet    Sig: Take 2 tablets (1,000 mg total) by mouth 2 (two) times daily with a meal. Needs appointment for refills.    Dispense:  360 tablet    Refill:  3  . lisinopril (ZESTRIL) 10 MG tablet    Sig: Take 1 tablet (10 mg total) by mouth daily.    Dispense:  90 tablet    Refill:  3  . insulin lispro (HUMALOG) 100 UNIT/ML KwikPen    Sig: INJECT 10 UNITS SUBCUTANEOUSLY THREE TIMES DAILY WITH  MEALS    Dispense:  15 mL    Refill:  3  . insulin glargine, 1 Unit Dial, (TOUJEO SOLOSTAR) 300 UNIT/ML Solostar Pen    Sig: Inject 70 Units into the skin daily.    Dispense:  6 mL    Refill:  11  . empagliflozin (JARDIANCE) 10 MG TABS tablet    Sig: Take 1 tablet (10 mg total) by mouth daily before breakfast.    Dispense:  90 tablet    Refill:  3  . LORazepam (ATIVAN) 0.5 MG tablet    Sig: Take 1 tablet (0.5 mg total) by mouth every 8 (eight) hours as needed for anxiety.    Dispense:  30 tablet    Refill:  0    Follow-up: Return in about 4 months (around 05/23/2020).    Carolann Littler, MD

## 2020-01-24 ENCOUNTER — Encounter: Payer: Self-pay | Admitting: Family Medicine

## 2020-01-24 MED ORDER — METHOCARBAMOL 500 MG PO TABS: 500.0000 mg | ORAL_TABLET | Freq: Three times a day (TID) | ORAL | 0 refills | Status: DC | PRN

## 2020-02-14 ENCOUNTER — Telehealth: Payer: Commercial Managed Care - PPO | Admitting: Family Medicine

## 2020-02-26 ENCOUNTER — Other Ambulatory Visit: Payer: Self-pay | Admitting: Family Medicine

## 2020-06-13 IMAGING — DX DG KNEE 1-2V*R*
2 series · 3 of 3 positions shown · non-contrast
Comparison: None.

CLINICAL DATA: Acute RIGHT knee pain following fall today. Initial
encounter.

EXAM:
RIGHT KNEE - 1-2 VIEW

[Series 1: knee ap · 0.14mm/px · 2 of 2 slices shown]
[im 1/2]
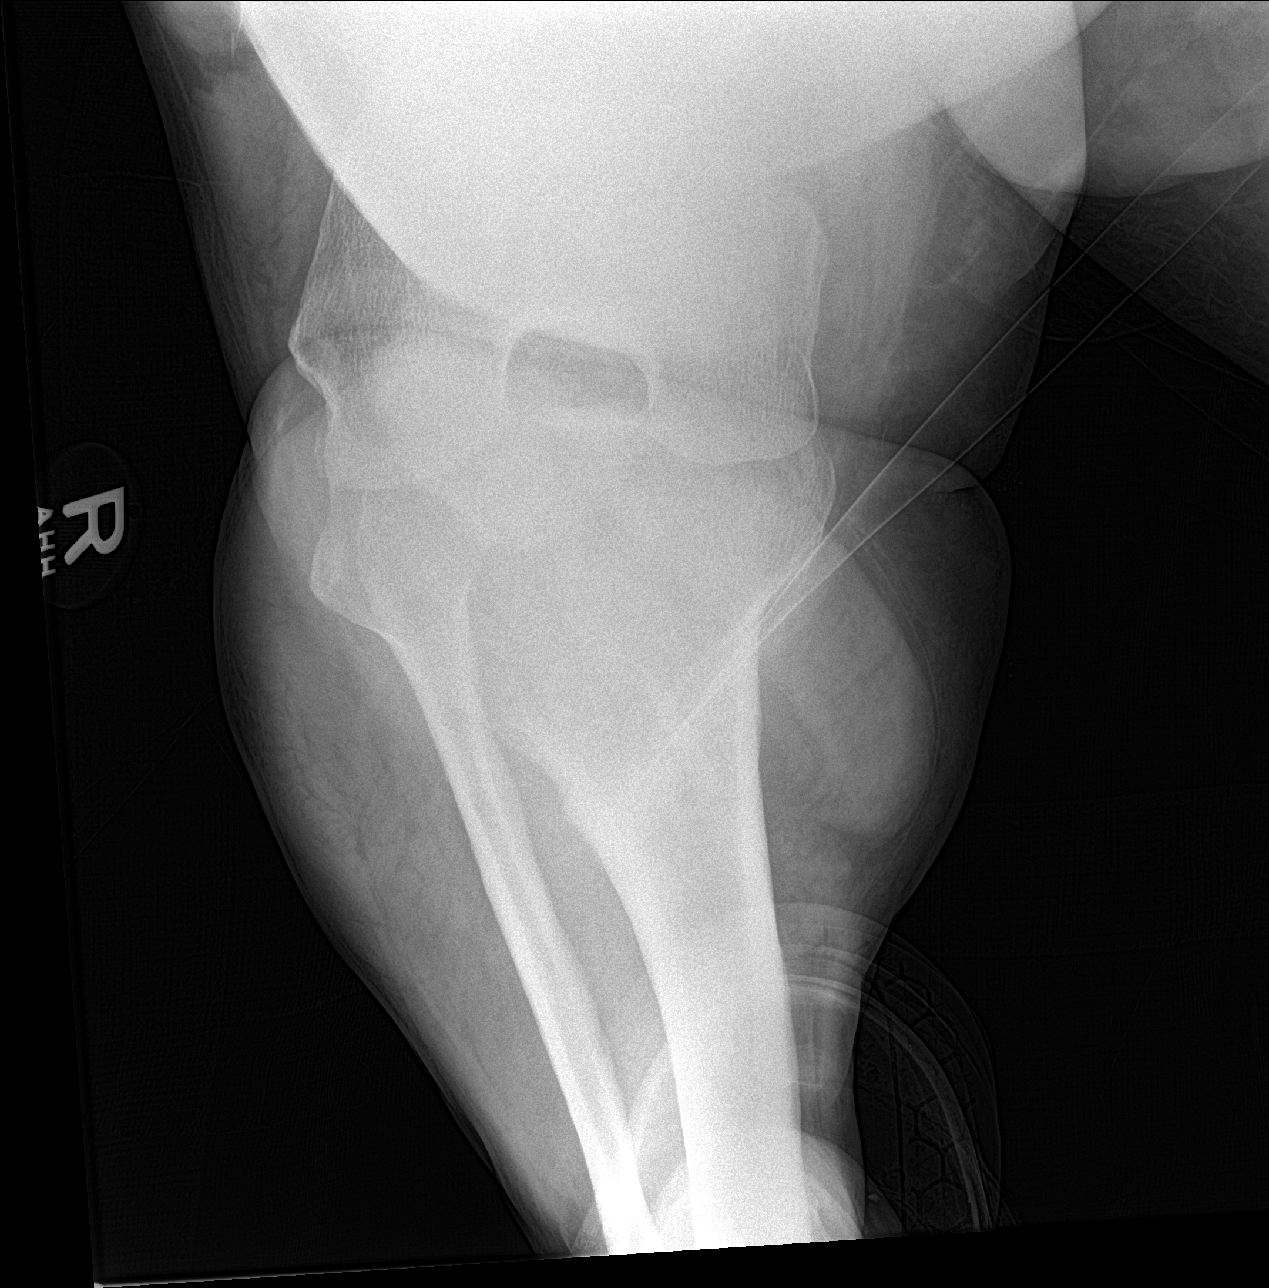
[im 2/2]
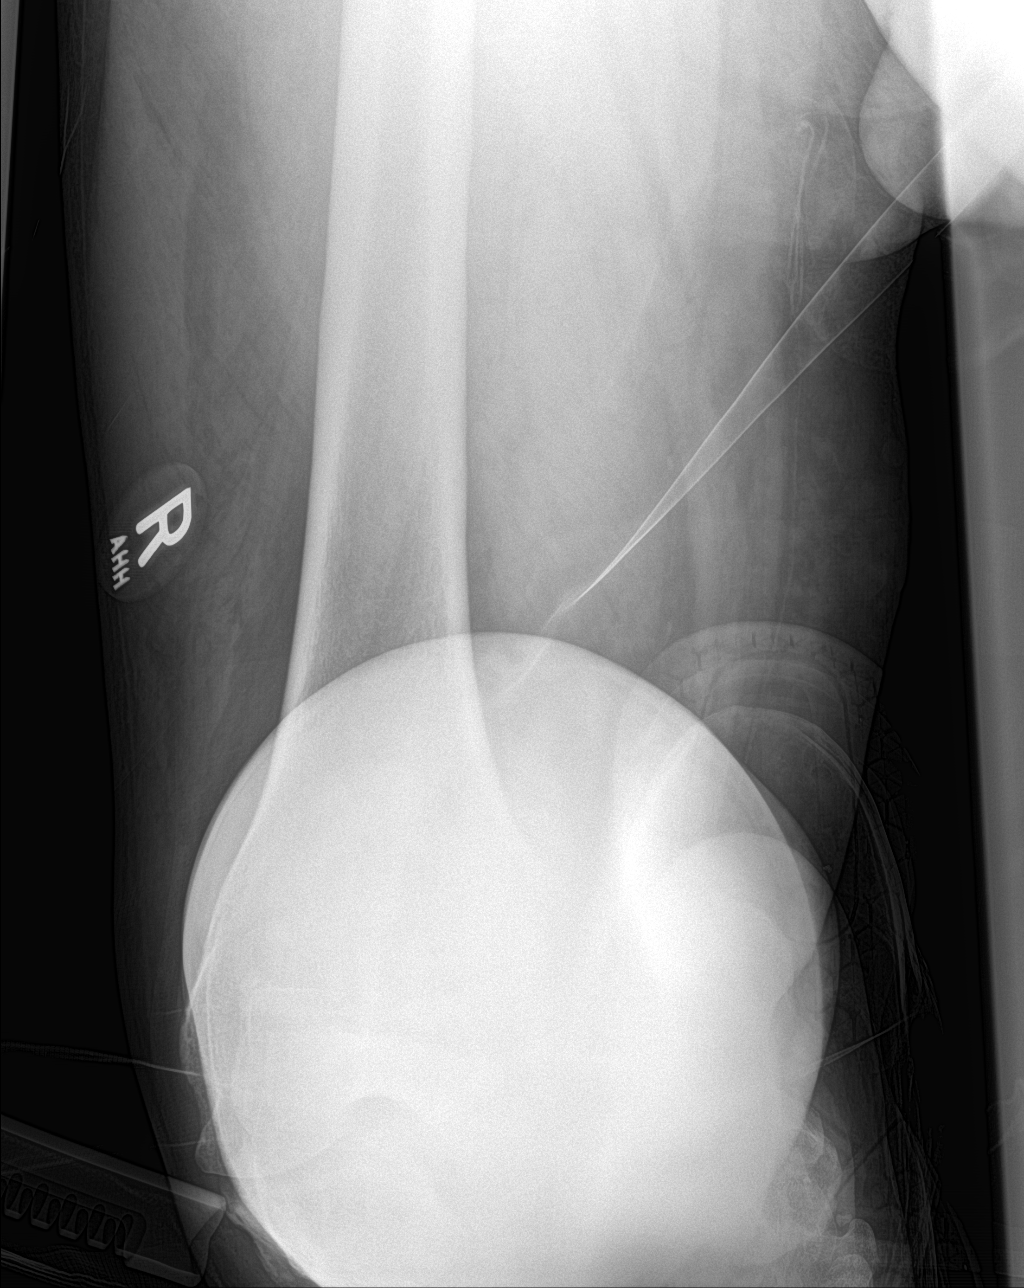

[knee lat]
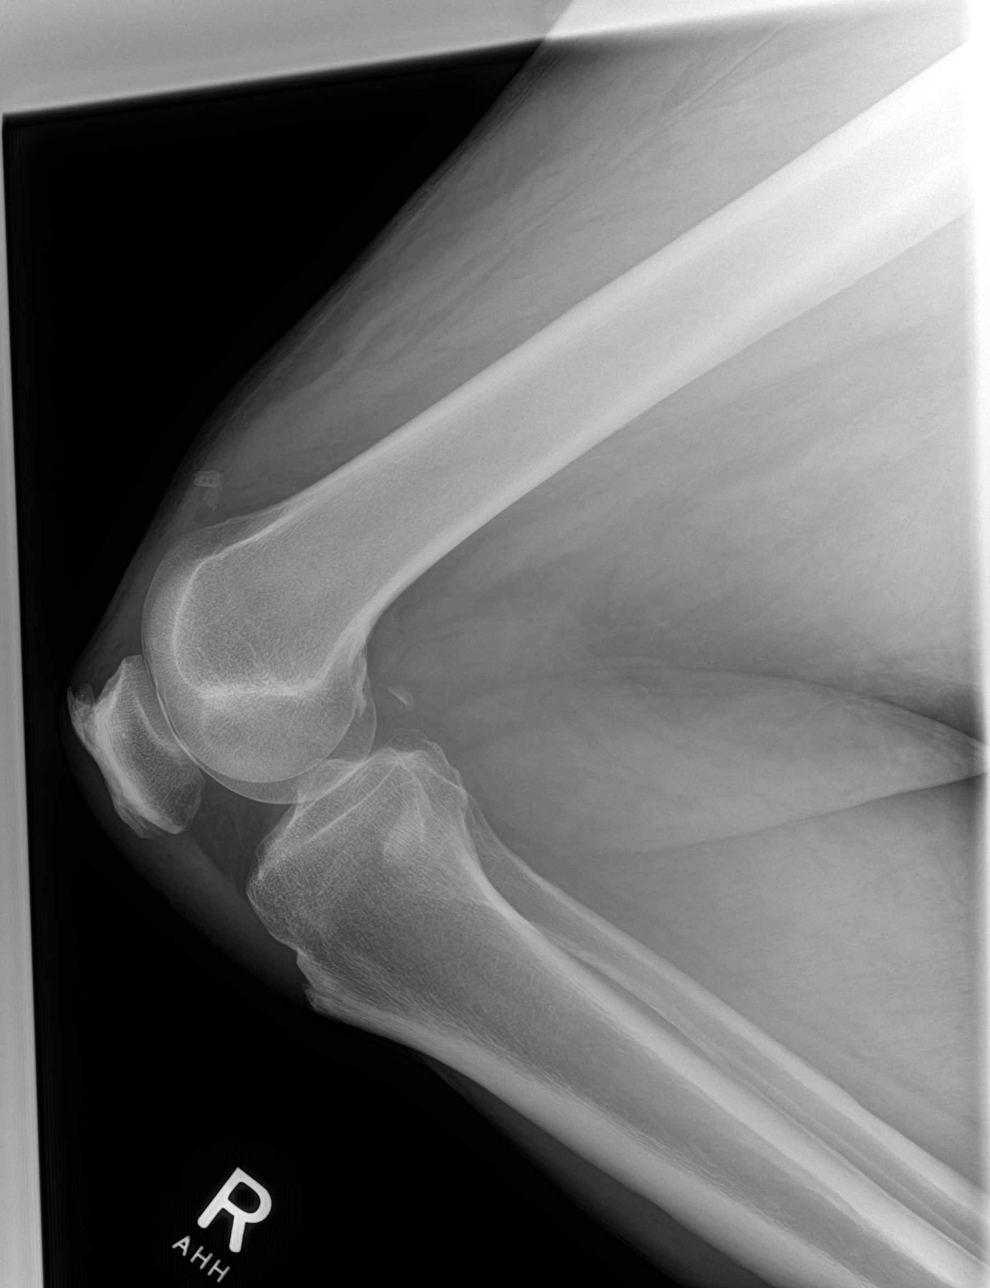

[3 of 3 positions shown; findings below may reference images not displayed]

FINDINGS: This study is limited technically as the patient cannot extend his
RIGHT knee.

Small bony densities anterior to the distal femur are noted and
could represent evidence of quadriceps tendon injury/avulsion
fracture. However, no significant soft tissue swelling is
noted-correlate clinically.

No other fracture, subluxation or dislocation identified.

No suspicious focal bony lesions noted.
IMPRESSION: Small bony densities anterior to the distal femur - correlate
clinically with possibility of quadriceps tendon injury/avulsion
fracture.

No other acute abnormalities identified.

## 2020-06-19 ENCOUNTER — Other Ambulatory Visit: Payer: Self-pay

## 2020-06-19 ENCOUNTER — Encounter: Payer: Self-pay | Admitting: Family Medicine

## 2020-06-19 ENCOUNTER — Ambulatory Visit (INDEPENDENT_AMBULATORY_CARE_PROVIDER_SITE_OTHER): Payer: Medicare Other | Admitting: Family Medicine

## 2020-06-19 VITALS — BP 120/60 | HR 94 | Ht 66.0 in | Wt 312.4 lb

## 2020-06-19 DIAGNOSIS — Z8619 Personal history of other infectious and parasitic diseases: Secondary | ICD-10-CM

## 2020-06-19 DIAGNOSIS — E785 Hyperlipidemia, unspecified: Secondary | ICD-10-CM | POA: Diagnosis not present

## 2020-06-19 DIAGNOSIS — I10 Essential (primary) hypertension: Secondary | ICD-10-CM | POA: Diagnosis not present

## 2020-06-19 DIAGNOSIS — E11649 Type 2 diabetes mellitus with hypoglycemia without coma: Secondary | ICD-10-CM | POA: Diagnosis not present

## 2020-06-19 LAB — POCT GLYCOSYLATED HEMOGLOBIN (HGB A1C): Hemoglobin A1C: 8.1 % — AB (ref 4.0–5.6)

## 2020-06-19 MED ORDER — ACYCLOVIR 800 MG PO TABS
800.0000 mg | ORAL_TABLET | Freq: Two times a day (BID) | ORAL | 2 refills | Status: DC
Start: 2020-06-19 — End: 2021-09-07

## 2020-06-19 MED ORDER — LISINOPRIL 10 MG PO TABS
10.0000 mg | ORAL_TABLET | Freq: Every day | ORAL | 3 refills | Status: DC
Start: 2020-06-19 — End: 2021-02-17

## 2020-06-19 MED ORDER — EMPAGLIFLOZIN 25 MG PO TABS: 25.0000 mg | ORAL_TABLET | Freq: Every day | ORAL | 3 refills | Status: DC

## 2020-06-19 MED ORDER — INSULIN LISPRO (1 UNIT DIAL) 100 UNIT/ML (KWIKPEN)
PEN_INJECTOR | SUBCUTANEOUS | 3 refills | Status: DC
Start: 2020-06-19 — End: 2020-06-22

## 2020-06-19 MED ORDER — PAROXETINE HCL 20 MG PO TABS
20.0000 mg | ORAL_TABLET | Freq: Every day | ORAL | 3 refills | Status: DC
Start: 2020-06-19 — End: 2021-09-07

## 2020-06-19 MED ORDER — LORAZEPAM 0.5 MG PO TABS
0.5000 mg | ORAL_TABLET | Freq: Three times a day (TID) | ORAL | 0 refills | Status: DC | PRN
Start: 2020-06-19 — End: 2021-02-17

## 2020-06-19 NOTE — Progress Notes (Signed)
Established Patient Office Visit  Subjective:  Patient ID: Tyler Andrews, male    DOB: [birth year]  Age: 65 y.o. MRN: [id number]  CC:  Chief Complaint  Patient presents with  . Medication Refill    HPI Tyler Andrews presents for medical follow-up.  He has history of morbid obesity, hypertension, obstructive sleep apnea, type 2 diabetes, history of recurrent depression, hyperlipidemia, history of herpes genitalis  He retired back in December.  He has started going to the [facility] [place] 2 days/week and hopes to build up on that.  Not lost any substantial weight yet.  He states he is compliant with his medications.  His blood sugars have not been well controlled at times.  His last A1c had improved slightly to 7.5%.  He has had previous intolerance with Trulicity secondary to nausea.  He is currently taking Jardiance, Metformin, and Toujeo along with Humalog.  No recent hypoglycemia.  Humalog is currently 10 units 3 times daily with meals.  Takes 70 units of Toujeo daily.  Denies any recent chest pains.  Lipids were controlled by recent labs.  He is requesting refills of several medications today.  He takes acyclovir as needed for herpes flareups.  He needs refills of his Humalog, Paxil, and simvastatin.  We have previously discussed concerns regarding Paxil with contributing to weight gain and also for individuals over 65.  He has been very reluctant to discontinue.  Past Medical History:  Diagnosis Date  . Chicken pox   . Depression   . Diabetes mellitus without complication ([place])   . Hypertension   . Urinary tract infection     Past Surgical History:  Procedure Laterality Date  . COLON SURGERY  02/13/2018   Dr. Celene Skeen in [place]  . KNEE SURGERY  10/2017   Right knee, tendon rupture    Family History  Problem Relation Age of Onset  . Diabetes Father   . Heart disease Father 27       CAD  . Cancer Maternal Grandfather        prostate    Social History   Socioeconomic  History  . Marital status: Divorced    Spouse name: Not on file  . Number of children: Not on file  . Years of education: Not on file  . Highest education level: Not on file  Occupational History  . Not on file  Tobacco Use  . Smoking status: Never Smoker  . Smokeless tobacco: Never Used  Vaping Use  . Vaping Use: Never used  Substance and Sexual Activity  . Alcohol use: No  . Drug use: No  . Sexual activity: Not on file  Other Topics Concern  . Not on file  Social History Narrative  . Not on file   Social Determinants of Health   Financial Resource Strain: Not on file  Food Insecurity: Not on file  Transportation Needs: Not on file  Physical Activity: Not on file  Stress: Not on file  Social Connections: Not on file  Intimate Partner Violence: Not on file    Outpatient Medications Prior to Visit  Medication Sig Dispense Refill  . aspirin 81 MG tablet Take 81 mg by mouth daily.    . insulin glargine, 1 Unit Dial, (TOUJEO SOLOSTAR) 300 UNIT/ML Solostar Pen Inject 70 Units into the skin daily. 6 mL 11  . metFORMIN (GLUCOPHAGE) 500 MG tablet Take 2 tablets (1,000 mg total) by mouth 2 (two) times daily with a meal. Needs appointment for refills.  360 tablet 3  . methocarbamol (ROBAXIN) 500 MG tablet Take 1 tablet (500 mg total) by mouth every 8 (eight) hours as needed for muscle spasms. 20 tablet 0  . Misc. Devices MISC Initiate Auto C-pap 5-15 cm with mask and supplies    . simvastatin (ZOCOR) 40 MG tablet TAKE 1 TABLET BY MOUTH AT BEDTIME 90 tablet 0  . acyclovir (ZOVIRAX) 800 MG tablet Take 1 tablet by mouth twice daily 60 tablet 0  . empagliflozin (JARDIANCE) 10 MG TABS tablet Take 1 tablet (10 mg total) by mouth daily before breakfast. 90 tablet 3  . insulin lispro (HUMALOG) 100 UNIT/ML KwikPen INJECT 10 UNITS SUBCUTANEOUSLY THREE TIMES DAILY WITH MEALS 15 mL 3  . lisinopril (ZESTRIL) 10 MG tablet Take 1 tablet (10 mg total) by mouth daily. 90 tablet 3  . LORazepam  (ATIVAN) 0.5 MG tablet Take 1 tablet (0.5 mg total) by mouth every 8 (eight) hours as needed for anxiety. 30 tablet 0  . PARoxetine (PAXIL) 20 MG tablet Take 1 tablet (20 mg total) by mouth daily. 90 tablet 3   No facility-administered medications prior to visit.    Allergies  Allergen Reactions  . Trulicity [Dulaglutide] Nausea Only    ROS Review of Systems  Constitutional: Negative for fatigue and unexpected weight change.  Eyes: Negative for visual disturbance.  Respiratory: Negative for cough, chest tightness and shortness of breath.   Cardiovascular: Negative for chest pain, palpitations and leg swelling.  Endocrine: Negative for polydipsia and polyuria.  Genitourinary: Negative for dysuria.  Neurological: Negative for dizziness, syncope, weakness, light-headedness and headaches.      Objective:    Physical Exam Vitals reviewed.  Cardiovascular:     Rate and Rhythm: Normal rate and regular rhythm.  Pulmonary:     Effort: Pulmonary effort is normal.     Breath sounds: Normal breath sounds.  Musculoskeletal:     Right lower leg: No edema.     Left lower leg: No edema.     BP 120/60   Pulse 94   Ht 5\' 6"  (1.676 m)   Wt (!) 312 lb 6 oz (141.7 kg)   SpO2 97%   BMI 50.42 kg/m  Wt Readings from Last 3 Encounters:  06/19/20 (!) 312 lb 6 oz (141.7 kg)  01/21/20 (!) 311 lb 12.8 oz (141.4 kg)  10/16/19 (!) 302 lb 8 oz (137.2 kg)     Health Maintenance Due  Topic Date Due  . HIV Screening  Never done  . FOOT EXAM  12/31/2019  . COVID-19 Vaccine (2 - Pfizer 3-dose series) 04/03/2020  . OPHTHALMOLOGY EXAM  05/14/2020    There are no preventive care reminders to display for this patient.  Lab Results  Component Value Date   TSH 2.48 09/04/2019   Lab Results  Component Value Date   WBC 10.5 09/04/2019   HGB 14.7 09/04/2019   HCT 44.8 09/04/2019   MCV 89.8 09/04/2019   PLT 287.0 09/04/2019   Lab Results  Component Value Date   NA 139 09/04/2019   K 4.7  09/04/2019   CO2 31 09/04/2019   GLUCOSE 131 (H) 09/04/2019   BUN 18 09/04/2019   CREATININE 0.78 09/04/2019   BILITOT 0.4 09/04/2019   ALKPHOS 62 09/04/2019   AST 12 09/04/2019   ALT 15 09/04/2019   PROT 6.8 09/04/2019   ALBUMIN 4.0 09/04/2019   CALCIUM 9.2 09/04/2019   GFR 99.87 09/04/2019   Lab Results  Component Value Date  CHOL 139 09/04/2019   Lab Results  Component Value Date   HDL 41.50 09/04/2019   Lab Results  Component Value Date   LDLCALC 68 09/04/2019   Lab Results  Component Value Date   TRIG 148.0 09/04/2019   Lab Results  Component Value Date   CHOLHDL 3 09/04/2019   Lab Results  Component Value Date   HGBA1C 8.1 (A) 06/19/2020      Assessment & Plan:   #1 type 2 diabetes suboptimally controlled with A1c 8.1%  -We strongly recommend try to lose some weight and step up his exercise consistency -Increase Jardiance to 25 mg daily -He has had previous intolerance with Trulicity but we did mention possible future consideration for low-dose Rybelsus starting at 3 mg daily and slow titration up if he can tolerate -55-month follow-up to reassess  #2 hypertension stable and well-controlled  #3 history of herpes genitalis -Refill acyclovir for as needed use  #4 hyperlipidemia treated with simvastatin -Reviewed recent lipids with patient.  Continue current dose of simvastatin.  #5 history of depression and anxiety.  He has described what sounds like intermittent panic attacks.  We did discuss our concerns with Paxil including weight gain and also increased risk with elderly and at this point he is reluctant to make changes.  Would like for him to consider more weight neutral SSRI such as Lexapro  Meds ordered this encounter  Medications  . empagliflozin (JARDIANCE) 25 MG TABS tablet    Sig: Take 1 tablet (25 mg total) by mouth daily before breakfast.    Dispense:  90 tablet    Refill:  3  . acyclovir (ZOVIRAX) 800 MG tablet    Sig: Take 1 tablet  (800 mg total) by mouth 2 (two) times daily.    Dispense:  60 tablet    Refill:  2  . lisinopril (ZESTRIL) 10 MG tablet    Sig: Take 1 tablet (10 mg total) by mouth daily.    Dispense:  90 tablet    Refill:  3  . insulin lispro (HUMALOG) 100 UNIT/ML KwikPen    Sig: INJECT 10 UNITS SUBCUTANEOUSLY THREE TIMES DAILY WITH MEALS    Dispense:  15 mL    Refill:  3  . PARoxetine (PAXIL) 20 MG tablet    Sig: Take 1 tablet (20 mg total) by mouth daily.    Dispense:  90 tablet    Refill:  3  . LORazepam (ATIVAN) 0.5 MG tablet    Sig: Take 1 tablet (0.5 mg total) by mouth every 8 (eight) hours as needed for anxiety.    Dispense:  30 tablet    Refill:  0    Follow-up: Return in about 3 months (around 09/19/2020).    Carolann Littler, MD

## 2020-06-19 NOTE — Patient Instructions (Signed)
Type 2 Diabetes Mellitus, Diagnosis, Adult Type 2 diabetes (type 2 diabetes mellitus) is a long-term, or chronic, disease. In type 2 diabetes, one or both of these problems may be present:  The pancreas does not make enough of a hormone called insulin.  Cells in the body do not respond properly to insulin that the body makes (insulin resistance). Normally, insulin allows blood sugar (glucose) to enter cells in the body. The cells use glucose for energy. Insulin resistance or lack of insulin causes excess glucose to build up in the blood instead of going into cells. This causes high blood glucose (hyperglycemia).  What are the causes? The exact cause of type 2 diabetes is not known. What increases the risk? The following factors may make you more likely to develop this condition:  Having a family member with type 2 diabetes.  Being overweight or obese.  Being inactive (sedentary).  Having been diagnosed with insulin resistance.  Having a history of prediabetes, diabetes when you were pregnant (gestational diabetes), or polycystic ovary syndrome (PCOS). What are the signs or symptoms? In the early stage of this condition, you may not have symptoms. Symptoms develop slowly and may include:  Increased thirst or hunger.  Increased urination.  Unexplained weight loss.  Tiredness (fatigue) or weakness.  Vision changes, such as blurry vision.  Dark patches on the skin. How is this diagnosed? This condition is diagnosed based on your symptoms, your medical history, a physical exam, and your blood glucose level. Your blood glucose may be checked with one or more of the following blood tests:  A fasting blood glucose (FBG) test. You will not be allowed to eat (you will fast) for 8 hours or longer before a blood sample is taken.  A random blood glucose test. This test checks blood glucose at any time of day regardless of when you ate.  An A1C (hemoglobin A1C) blood test. This test  provides information about blood glucose levels over the previous 2-3 months.  An oral glucose tolerance test (OGTT). This test measures your blood glucose at two times: ? After fasting. This is your baseline blood glucose level. ? Two hours after drinking a beverage that contains glucose. You may be diagnosed with type 2 diabetes if:  Your fasting blood glucose level is 126 mg/dL (7.0 mmol/L) or higher.  Your random blood glucose level is 200 mg/dL (11.1 mmol/L) or higher.  Your A1C level is 6.5% or higher.  Your oral glucose tolerance test result is higher than 200 mg/dL (11.1 mmol/L). These blood tests may be repeated to confirm your diagnosis.   How is this treated? Your treatment may be managed by a specialist called an endocrinologist. Type 2 diabetes may be treated by following instructions from your health care provider about:  Making dietary and lifestyle changes. These may include: ? Following a personalized nutrition plan that is developed by a registered dietitian. ? Exercising regularly. ? Finding ways to manage stress.  Checking your blood glucose level as often as told.  Taking diabetes medicines or insulin daily. This helps to keep your blood glucose levels in the healthy range.  Taking medicines to help prevent complications from diabetes. Medicines may include: ? Aspirin. ? Medicine to lower cholesterol. ? Medicine to control blood pressure. Your health care provider will set treatment goals for you. Your goals will be based on your age, other medical conditions you have, and how you respond to diabetes treatment. Generally, the goal of treatment is to maintain the   following blood glucose levels:  Before meals: 80-130 mg/dL (4.4-7.2 mmol/L).  After meals: below 180 mg/dL (10 mmol/L).  A1C level: less than 7%. Follow these instructions at home: Questions to ask your health care provider Consider asking the following questions:  Should I meet with a certified  diabetes care and education specialist?  What diabetes medicines do I need, and when should I take them?  What equipment will I need to manage my diabetes at home?  How often do I need to check my blood glucose?  Where can I find a support group for people with diabetes?  What number can I call if I have questions?  When is my next appointment? General instructions  Take over-the-counter and prescription medicines only as told by your health care provider.  Keep all follow-up visits as told by your health care provider. This is important. Where to find more information  American Diabetes Association (ADA): www.diabetes.org  American Association of Diabetes Care and Education Specialists (ADCES): www.diabeteseducator.org  International Diabetes Federation (IDF): www.idf.org Contact a health care provider if:  Your blood glucose is at or above 240 mg/dL (13.3 mmol/L) for 2 days in a row.  You have been sick or have had a fever for 2 days or longer, and you are not getting better.  You have any of the following problems for more than 6 hours: ? You cannot eat or drink. ? You have nausea and vomiting. ? You have diarrhea. Get help right away if:  You have severe hypoglycemia. This means your blood glucose is lower than 54 mg/dL (3.0 mmol/L).  You become confused or you have trouble thinking clearly.  You have difficulty breathing.  You have moderate or large ketone levels in your urine. These symptoms may represent a serious problem that is an emergency. Do not wait to see if the symptoms will go away. Get medical help right away. Call your local emergency services (911 in the [place] Do not drive yourself to the hospital. Summary  Type 2 diabetes (type 2 diabetes mellitus) is a long-term, or chronic, disease. In type 2 diabetes, the pancreas does not make enough of a hormone called insulin, or cells in the body do not respond properly to insulin that the body makes (insulin  resistance).  This condition is treated by making dietary and lifestyle changes and taking diabetes medicines or insulin.  Your health care provider will set treatment goals for you. Your goals will be based on your age, other medical conditions you have, and how you respond to diabetes treatment.  Keep all follow-up visits as told by your health care provider. This is important. This information is not intended to replace advice given to you by your health care provider. Make sure you discuss any questions you have with your health care provider. Document Revised: 10/16/2019 Document Reviewed: 10/16/2019 Elsevier Patient Education  2021 Elsevier Inc.  

## 2020-06-22 ENCOUNTER — Encounter: Payer: Self-pay | Admitting: Family Medicine

## 2020-06-22 MED ORDER — INSULIN LISPRO (1 UNIT DIAL) 100 UNIT/ML (KWIKPEN)
PEN_INJECTOR | SUBCUTANEOUS | 3 refills | Status: DC
Start: 2020-06-22 — End: 2020-10-20

## 2020-07-02 ENCOUNTER — Encounter: Payer: Self-pay | Admitting: Family Medicine

## 2020-07-03 MED ORDER — TOUJEO SOLOSTAR 300 UNIT/ML ~~LOC~~ SOPN: 70.0000 [IU] | PEN_INJECTOR | Freq: Every day | SUBCUTANEOUS | 11 refills | Status: DC

## 2020-07-05 ENCOUNTER — Encounter: Payer: Self-pay | Admitting: Family Medicine

## 2020-07-06 MED ORDER — TOUJEO SOLOSTAR 300 UNIT/ML ~~LOC~~ SOPN: 70.0000 [IU] | PEN_INJECTOR | Freq: Every day | SUBCUTANEOUS | 11 refills | Status: DC

## 2020-07-06 NOTE — Addendum Note (Signed)
Addended by: Rebecca Eaton on: 07/06/2020 02:44 PM   Modules accepted: Orders

## 2020-08-03 ENCOUNTER — Telehealth: Payer: Medicare Other | Admitting: Family

## 2020-08-03 ENCOUNTER — Encounter: Payer: Self-pay | Admitting: Family Medicine

## 2020-08-03 DIAGNOSIS — J069 Acute upper respiratory infection, unspecified: Secondary | ICD-10-CM | POA: Diagnosis not present

## 2020-08-03 MED ORDER — FLUTICASONE PROPIONATE 50 MCG/ACT NA SUSP: 2.0000 | Freq: Every day | NASAL | 6 refills | Status: AC

## 2020-08-03 MED ORDER — BENZONATATE 100 MG PO CAPS: 100.0000 mg | ORAL_CAPSULE | Freq: Three times a day (TID) | ORAL | 0 refills | Status: DC | PRN

## 2020-08-03 NOTE — Progress Notes (Signed)

## 2020-08-23 ENCOUNTER — Encounter: Payer: Self-pay | Admitting: Family Medicine

## 2020-10-19 ENCOUNTER — Other Ambulatory Visit: Payer: Self-pay | Admitting: Family Medicine

## 2020-11-23 ENCOUNTER — Other Ambulatory Visit: Payer: Self-pay | Admitting: Family Medicine

## 2020-12-18 ENCOUNTER — Other Ambulatory Visit: Payer: Self-pay | Admitting: Family Medicine

## 2021-02-17 ENCOUNTER — Ambulatory Visit (INDEPENDENT_AMBULATORY_CARE_PROVIDER_SITE_OTHER): Payer: Medicare Other | Admitting: Family Medicine

## 2021-02-17 VITALS — BP 140/70 | HR 76 | Temp 97.9°F | Ht 66.0 in | Wt 313.3 lb

## 2021-02-17 DIAGNOSIS — Z23 Encounter for immunization: Secondary | ICD-10-CM | POA: Diagnosis not present

## 2021-02-17 DIAGNOSIS — Z0000 Encounter for general adult medical examination without abnormal findings: Secondary | ICD-10-CM | POA: Diagnosis not present

## 2021-02-17 LAB — HEPATIC FUNCTION PANEL
ALT: 13 U/L (ref 0–53)
AST: 11 U/L (ref 0–37)
Albumin: 4.1 g/dL (ref 3.5–5.2)
Alkaline Phosphatase: 59 U/L (ref 39–117)
Bilirubin, Direct: 0.1 mg/dL (ref 0.0–0.3)
Total Bilirubin: 0.4 mg/dL (ref 0.2–1.2)
Total Protein: 6.9 g/dL (ref 6.0–8.3)

## 2021-02-17 LAB — CBC WITH DIFFERENTIAL/PLATELET
Basophils Absolute: 0 10*3/uL (ref 0.0–0.1)
Basophils Relative: 0.5 % (ref 0.0–3.0)
Eosinophils Absolute: 0.3 10*3/uL (ref 0.0–0.7)
Eosinophils Relative: 3.3 % (ref 0.0–5.0)
HCT: 44 % (ref 39.0–52.0)
Hemoglobin: 14.2 g/dL (ref 13.0–17.0)
Lymphocytes Relative: 27.8 % (ref 12.0–46.0)
Lymphs Abs: 2.5 10*3/uL (ref 0.7–4.0)
MCHC: 32.2 g/dL (ref 30.0–36.0)
MCV: 89.4 fl (ref 78.0–100.0)
Monocytes Absolute: 0.9 10*3/uL (ref 0.1–1.0)
Monocytes Relative: 10.2 % (ref 3.0–12.0)
Neutro Abs: 5.3 10*3/uL (ref 1.4–7.7)
Neutrophils Relative %: 58.2 % (ref 43.0–77.0)
Platelets: 296 10*3/uL (ref 150.0–400.0)
RBC: 4.92 Mil/uL (ref 4.22–5.81)
RDW: 15.2 % (ref 11.5–15.5)
WBC: 9.1 10*3/uL (ref 4.0–10.5)

## 2021-02-17 LAB — BASIC METABOLIC PANEL
BUN: 19 mg/dL (ref 6–23)
CO2: 29 mEq/L (ref 19–32)
Calcium: 9.2 mg/dL (ref 8.4–10.5)
Chloride: 103 mEq/L (ref 96–112)
Creatinine, Ser: 0.84 mg/dL (ref 0.40–1.50)
GFR: 90.85 mL/min (ref >60.00)
Glucose, Bld: 122 mg/dL — ABNORMAL HIGH (ref 70–99)
Potassium: 4.6 mEq/L (ref 3.5–5.1)
Sodium: 140 mEq/L (ref 135–145)

## 2021-02-17 LAB — LIPID PANEL
Cholesterol: 125 mg/dL (ref 0–200)
HDL: 41.6 mg/dL (ref >39.00)
LDL Cholesterol: 55 mg/dL (ref 0–99)
NonHDL: 83.89
Total CHOL/HDL Ratio: 3
Triglycerides: 142 mg/dL (ref 0.0–149.0)
VLDL: 28.4 mg/dL (ref 0.0–40.0)

## 2021-02-17 LAB — HEMOGLOBIN A1C: Hgb A1c MFr Bld: 7.3 % — ABNORMAL HIGH (ref 4.6–6.5)

## 2021-02-17 LAB — TSH: TSH: 4.28 u[IU]/mL (ref 0.35–5.50)

## 2021-02-17 LAB — PSA: PSA: 0.38 ng/mL (ref 0.10–4.00)

## 2021-02-17 MED ORDER — LORAZEPAM 0.5 MG PO TABS: 0.5000 mg | ORAL_TABLET | Freq: Three times a day (TID) | ORAL | 0 refills | Status: AC | PRN

## 2021-02-17 MED ORDER — SIMVASTATIN 40 MG PO TABS: 40.0000 mg | ORAL_TABLET | Freq: Every day | ORAL | 3 refills | Status: DC

## 2021-02-17 MED ORDER — LISINOPRIL 10 MG PO TABS: 10.0000 mg | ORAL_TABLET | Freq: Every day | ORAL | 3 refills | Status: DC

## 2021-02-17 MED ORDER — METFORMIN HCL 500 MG PO TABS: 1000.0000 mg | ORAL_TABLET | Freq: Two times a day (BID) | ORAL | 3 refills | Status: DC

## 2021-02-17 MED ORDER — EMPAGLIFLOZIN 25 MG PO TABS: 25.0000 mg | ORAL_TABLET | Freq: Every day | ORAL | 3 refills | Status: DC

## 2021-02-17 NOTE — Patient Instructions (Signed)
Consider checking with insurance to see if they cover [employer]

## 2021-02-17 NOTE — Progress Notes (Signed)
Established Patient Office Visit  Subjective:  Patient ID: Tyler Andrews, male    DOB: [birth year]  Age: 65 y.o. MRN: [id number]  CC:  Chief Complaint  Patient presents with   Medicare Wellness    HPI ROMANO STIGGER presents for physical exam.  He just retired this past January.  He states he has been very "lazy ".  [facility] but not consistently [facility].  Very poor compliance with diet.  Eats a lot of snack foods.  He would like to consider seeing CDE.  Has had recent eye exam.  No neuropathy symptoms.  Not monitoring blood sugars regularly.  Last A1c 8.1%.  Previous intolerance with Trulicity.  Remains on metformin, Jardiance, and fairly high-dose insulin.  Does not bring log of blood sugars today.  Health maintenance reviewed  -Flu vaccine already given.  Has not had Prevnar since turning 64.  Has had Pneumovax.  Other vaccines up-to-date. Colonoscopy due 2029.  Tetanus due 2028  -Family history and social history reviewed with no significant changes  Past Medical History:  Diagnosis Date   Chicken pox    Depression    Diabetes mellitus without complication ([place])    Hypertension    Urinary tract infection     Past Surgical History:  Procedure Laterality Date   COLON SURGERY  02/13/2018   Dr. Celene Skeen in [place]  10/2017   Right knee, tendon rupture    Family History  Problem Relation Age of Onset   Diabetes Father    Heart disease Father 74       CAD   Cancer Maternal Grandfather        prostate    Social History   Socioeconomic History   Marital status: Divorced    Spouse name: Not on file   Number of children: Not on file   Years of education: Not on file   Highest education level: Not on file  Occupational History   Not on file  Tobacco Use   Smoking status: Never   Smokeless tobacco: Never  Vaping Use   Vaping Use: Never used  Substance and Sexual Activity   Alcohol use: No   Drug use: No    Sexual activity: Not on file  Other Topics Concern   Not on file  Social History Narrative   Not on file   Social Determinants of Health   Financial Resource Strain: Not on file  Food Insecurity: Not on file  Transportation Needs: Not on file  Physical Activity: Not on file  Stress: Not on file  Social Connections: Not on file  Intimate Partner Violence: Not on file    Outpatient Medications Prior to Visit  Medication Sig Dispense Refill   acyclovir (ZOVIRAX) 800 MG tablet Take 1 tablet (800 mg total) by mouth 2 (two) times daily. 60 tablet 2   aspirin 81 MG tablet Take 81 mg by mouth daily.     fluticasone (FLONASE) 50 MCG/ACT nasal spray Place 2 sprays into both nostrils daily. 16 g 6   insulin glargine, 1 Unit Dial, (TOUJEO SOLOSTAR) 300 UNIT/ML Solostar Pen Inject 70 Units into the skin daily. Inject 90 units into the skin daily. 12 mL 11   insulin lispro (HUMALOG) 100 UNIT/ML KwikPen INJECT 20 UNITS SUBCUTANEOUSLY THREE TIMES DAILY WITH MEALS 15 mL 3   methocarbamol (ROBAXIN) 500 MG tablet Take 1 tablet (500 mg total) by mouth every 8 (eight) hours  as needed for muscle spasms. 20 tablet 0   Misc. Devices MISC Initiate Auto C-pap 5-15 cm with mask and supplies     PARoxetine (PAXIL) 20 MG tablet Take 1 tablet (20 mg total) by mouth daily. 90 tablet 3   benzonatate (TESSALON PERLES) 100 MG capsule Take 1 capsule (100 mg total) by mouth 3 (three) times daily as needed. 20 capsule 0   empagliflozin (JARDIANCE) 25 MG TABS tablet Take 1 tablet (25 mg total) by mouth daily before breakfast. 90 tablet 3   lisinopril (ZESTRIL) 10 MG tablet Take 1 tablet (10 mg total) by mouth daily. 90 tablet 3   LORazepam (ATIVAN) 0.5 MG tablet Take 1 tablet (0.5 mg total) by mouth every 8 (eight) hours as needed for anxiety. 30 tablet 0   metFORMIN (GLUCOPHAGE) 500 MG tablet Take 2 tablets (1,000 mg total) by mouth 2 (two) times daily with a meal. Needs appointment for refills. 360 tablet 3    simvastatin (ZOCOR) 40 MG tablet TAKE 1 TABLET BY MOUTH AT BEDTIME 90 tablet 0   No facility-administered medications prior to visit.    Allergies  Allergen Reactions   Trulicity [Dulaglutide] Nausea Only    ROS Review of Systems  Constitutional:  Positive for fatigue. Negative for activity change, appetite change and fever.  HENT:  Negative for congestion, ear pain and trouble swallowing.   Eyes:  Negative for pain and visual disturbance.  Respiratory:  Negative for cough, shortness of breath and wheezing.   Cardiovascular:  Negative for chest pain and palpitations.  Gastrointestinal:  Negative for abdominal distention, abdominal pain, blood in stool, constipation, diarrhea, nausea, rectal pain and vomiting.  Genitourinary:  Negative for dysuria, hematuria and testicular pain.  Musculoskeletal:  Negative for arthralgias and joint swelling.  Skin:  Negative for rash.  Neurological:  Negative for dizziness, syncope and headaches.  Hematological:  Negative for adenopathy.  Psychiatric/Behavioral:  Negative for confusion and dysphoric mood.      Objective:    Physical Exam Constitutional:      General: He is not in acute distress.    Appearance: He is well-developed.  HENT:     Head: Normocephalic and atraumatic.     Right Ear: External ear normal.     Left Ear: External ear normal.  Eyes:     Conjunctiva/sclera: Conjunctivae normal.     Pupils: Pupils are equal, round, and reactive to light.  Neck:     Thyroid: No thyromegaly.  Cardiovascular:     Rate and Rhythm: Normal rate and regular rhythm.     Heart sounds: Normal heart sounds. No murmur heard. Pulmonary:     Effort: No respiratory distress.     Breath sounds: No wheezing or rales.  Abdominal:     General: Bowel sounds are normal. There is no distension.     Palpations: Abdomen is soft. There is no mass.     Tenderness: There is no abdominal tenderness. There is no guarding or rebound.  Musculoskeletal:      Cervical back: Normal range of motion and neck supple.  Lymphadenopathy:     Cervical: No cervical adenopathy.  Skin:    Findings: No rash.     Comments: Both feet are warm to touch.  He has some dryness of both heels but no ulceration.  Normal sensory function with monofilament testing.  Neurological:     Mental Status: He is alert and oriented to person, place, and time.     Cranial Nerves: No  cranial nerve deficit.    BP 140/70 (BP Location: Left Arm, Patient Position: Sitting, Cuff Size: Normal)   Pulse 76   Temp 97.9 F (36.6 C) (Oral)   Ht 5\' 6"  (1.676 m)   Wt (!) 313 lb 4.8 oz (142.1 kg)   SpO2 96%   BMI 50.57 kg/m  Wt Readings from Last 3 Encounters:  02/17/21 (!) 313 lb 4.8 oz (142.1 kg)  06/19/20 (!) 312 lb 6 oz (141.7 kg)  01/21/20 (!) 311 lb 12.8 oz (141.4 kg)     Health Maintenance Due  Topic Date Due   FOOT EXAM  12/31/2019   HEMOGLOBIN A1C  12/20/2020    There are no preventive care reminders to display for this patient.  Lab Results  Component Value Date   TSH 2.48 09/04/2019   Lab Results  Component Value Date   WBC 10.5 09/04/2019   HGB 14.7 09/04/2019   HCT 44.8 09/04/2019   MCV 89.8 09/04/2019   PLT 287.0 09/04/2019   Lab Results  Component Value Date   NA 139 09/04/2019   K 4.7 09/04/2019   CO2 31 09/04/2019   GLUCOSE 131 (H) 09/04/2019   BUN 18 09/04/2019   CREATININE 0.78 09/04/2019   BILITOT 0.4 09/04/2019   ALKPHOS 62 09/04/2019   AST 12 09/04/2019   ALT 15 09/04/2019   PROT 6.8 09/04/2019   ALBUMIN 4.0 09/04/2019   CALCIUM 9.2 09/04/2019   GFR 99.87 09/04/2019   Lab Results  Component Value Date   CHOL 139 09/04/2019   Lab Results  Component Value Date   HDL 41.50 09/04/2019   Lab Results  Component Value Date   LDLCALC 68 09/04/2019   Lab Results  Component Value Date   TRIG 148.0 09/04/2019   Lab Results  Component Value Date   CHOLHDL 3 09/04/2019   Lab Results  Component Value Date   HGBA1C 8.1 (A)  06/19/2020      Assessment & Plan:   Problem List Items Addressed This Visit   None Visit Diagnoses     Physical exam    -  Primary   Relevant Orders   Basic metabolic panel   Lipid panel   CBC with Differential/Platelet   TSH   Hepatic function panel   Hemoglobin A1c   PSA     History of poorly controlled diabetes.  Very poor compliance with diet and exercise.  We strongly recommend he set up to see CDE.  We recommend establish more consistent exercise.  Scaled back sugar and starch intake.  -Prevnar 13 given -Continue annual flu vaccine -Other vaccines up-to-date -Obtain labs as above. -Set up 69-month follow-up  Meds ordered this encounter  Medications   LORazepam (ATIVAN) 0.5 MG tablet    Sig: Take 1 tablet (0.5 mg total) by mouth every 8 (eight) hours as needed for anxiety.    Dispense:  30 tablet    Refill:  0   simvastatin (ZOCOR) 40 MG tablet    Sig: Take 1 tablet (40 mg total) by mouth at bedtime.    Dispense:  90 tablet    Refill:  3   metFORMIN (GLUCOPHAGE) 500 MG tablet    Sig: Take 2 tablets (1,000 mg total) by mouth 2 (two) times daily with a meal. Needs appointment for refills.    Dispense:  360 tablet    Refill:  3   empagliflozin (JARDIANCE) 25 MG TABS tablet    Sig: Take 1 tablet (25 mg total) by  mouth daily before breakfast.    Dispense:  90 tablet    Refill:  3   lisinopril (ZESTRIL) 10 MG tablet    Sig: Take 1 tablet (10 mg total) by mouth daily.    Dispense:  90 tablet    Refill:  3    Follow-up: Return in about 3 months (around 05/20/2021).    Carolann Littler, MD

## 2021-03-30 ENCOUNTER — Other Ambulatory Visit: Payer: Self-pay | Admitting: Family Medicine

## 2021-04-09 ENCOUNTER — Other Ambulatory Visit: Payer: Self-pay | Admitting: Family Medicine

## 2021-04-10 ENCOUNTER — Other Ambulatory Visit: Payer: Self-pay | Admitting: Family Medicine

## 2021-04-12 MED ORDER — INSULIN LISPRO (1 UNIT DIAL) 100 UNIT/ML (KWIKPEN): PEN_INJECTOR | SUBCUTANEOUS | 0 refills | Status: DC

## 2021-05-21 ENCOUNTER — Ambulatory Visit (INDEPENDENT_AMBULATORY_CARE_PROVIDER_SITE_OTHER): Payer: Medicare Other | Admitting: Family Medicine

## 2021-05-21 VITALS — BP 148/68 | HR 105 | Temp 97.6°F | Wt 313.7 lb

## 2021-05-21 DIAGNOSIS — I10 Essential (primary) hypertension: Secondary | ICD-10-CM | POA: Diagnosis not present

## 2021-05-21 DIAGNOSIS — E11649 Type 2 diabetes mellitus with hypoglycemia without coma: Secondary | ICD-10-CM

## 2021-05-21 LAB — POCT GLYCOSYLATED HEMOGLOBIN (HGB A1C): Hemoglobin A1C: 7.3 % — AB (ref 4.0–5.6)

## 2021-05-21 MED ORDER — MOUNJARO 2.5 MG/0.5ML ~~LOC~~ SOAJ: SUBCUTANEOUS | 2 refills | Status: DC

## 2021-05-21 NOTE — Progress Notes (Signed)
Established Patient Office Visit  Subjective:  Patient ID: Tyler Andrews, male    DOB: [birth year]  Age: 66 y.o. MRN: [id number]  CC:  Chief Complaint  Patient presents with   Follow-up    HPI Tyler Andrews presents for medical follow-up.  His chronic problems include hypertension, obstructive sleep apnea, morbid obesity, history of depression.  Last A1c was 7.3%.  He is currently retired.  We had hoped that he would be able to lose some weight and step up exercise and make some dietary changes.  His weight is unchanged from physical exam back in November.  Blood pressure marginal control at that time.  Currently takes only lisinopril 10 mg daily for hypertension.  His current diabetes regimen is Humalog 20 units 3 times daily with meals Toujeo 90 units daily.  He takes Jardiance 25 mg daily and metformin.  Previous intolerance with Trulicity.  He has heard of other diabetic medication such as Ozempic and Mounjaro and would like to consider those possibilities.  Does not recall trying other GLP-1 medications.  His nausea with the Trulicity was relatively mild  Wt Readings from Last 3 Encounters:  05/21/21 (!) 313 lb 11.2 oz (142.3 kg)  02/17/21 (!) 313 lb 4.8 oz (142.1 kg)  06/19/20 (!) 312 lb 6 oz (141.7 kg)     Past Medical History:  Diagnosis Date   Chicken pox    Depression    Diabetes mellitus without complication (HCC)    Hypertension    Urinary tract infection     Past Surgical History:  Procedure Laterality Date   COLON SURGERY  02/13/2018   Dr. Celene Skeen in [place]  10/2017   Right knee, tendon rupture    Family History  Problem Relation Age of Onset   Diabetes Father    Heart disease Father 98       CAD   Cancer Maternal Grandfather        prostate    Social History   Socioeconomic History   Marital status: Divorced    Spouse name: Not on file   Number of children: Not on file   Years of education: Not on file   Highest  education level: Associate degree: occupational, Hotel manager, or vocational program  Occupational History   Not on file  Tobacco Use   Smoking status: Never   Smokeless tobacco: Never  Vaping Use   Vaping Use: Never used  Substance and Sexual Activity   Alcohol use: No   Drug use: No   Sexual activity: Not on file  Other Topics Concern   Not on file  Social History Narrative   Not on file   Social Determinants of Health   Financial Resource Strain: Low Risk    Difficulty of Paying Living Expenses: Not very hard  Food Insecurity: No Food Insecurity   Worried About Charity fundraiser in the Last Year: Never true   [place] in the Last Year: Never true  Transportation Needs: No Transportation Needs   Lack of Transportation (Medical): No   Lack of Transportation (Non-Medical): No  Physical Activity: Sufficiently Active   Days of Exercise per Week: 3 days   Minutes of Exercise per Session: 50 min  Stress: Stress Concern Present   Feeling of Stress : To some extent  Social Connections: Moderately Integrated   Frequency of Communication with Friends and Family: More than three times a week   Frequency of Social Gatherings  with Friends and Family: Three times a week   Attends Religious Services: 1 to 4 times per year   Active Member of Clubs or Organizations: No   Attends Music therapist: Not on file   Marital Status: Married  Human resources officer Violence: Not on file    Outpatient Medications Prior to Visit  Medication Sig Dispense Refill   acyclovir (ZOVIRAX) 800 MG tablet Take 1 tablet (800 mg total) by mouth 2 (two) times daily. 60 tablet 2   aspirin 81 MG tablet Take 81 mg by mouth daily.     empagliflozin (JARDIANCE) 25 MG TABS tablet Take 1 tablet (25 mg total) by mouth daily before breakfast. 90 tablet 3   fluticasone (FLONASE) 50 MCG/ACT nasal spray Place 2 sprays into both nostrils daily. 16 g 6   insulin glargine, 1 Unit Dial, (TOUJEO SOLOSTAR) 300  UNIT/ML Solostar Pen Inject 70 Units into the skin daily. Inject 90 units into the skin daily. 12 mL 11   insulin lispro (HUMALOG) 100 UNIT/ML KwikPen INJECT 20 UNITS THREE TIMES DAILY WITH MEALS 15 mL 0   lisinopril (ZESTRIL) 20 MG tablet Take 20 mg by mouth daily.     LORazepam (ATIVAN) 0.5 MG tablet Take 1 tablet (0.5 mg total) by mouth every 8 (eight) hours as needed for anxiety. 30 tablet 0   metFORMIN (GLUCOPHAGE) 500 MG tablet Take 2 tablets (1,000 mg total) by mouth 2 (two) times daily with a meal. Needs appointment for refills. 360 tablet 3   methocarbamol (ROBAXIN) 500 MG tablet Take 1 tablet (500 mg total) by mouth every 8 (eight) hours as needed for muscle spasms. 20 tablet 0   Misc. Devices MISC Initiate Auto C-pap 5-15 cm with mask and supplies     PARoxetine (PAXIL) 20 MG tablet Take 1 tablet (20 mg total) by mouth daily. 90 tablet 3   simvastatin (ZOCOR) 40 MG tablet Take 1 tablet (40 mg total) by mouth at bedtime. 90 tablet 3   lisinopril (ZESTRIL) 10 MG tablet Take 1 tablet (10 mg total) by mouth daily. 90 tablet 3   No facility-administered medications prior to visit.    Allergies  Allergen Reactions   Trulicity [Dulaglutide] Nausea Only    ROS Review of Systems  Constitutional:  Negative for fatigue and unexpected weight change.  Eyes:  Negative for visual disturbance.  Respiratory:  Negative for cough, chest tightness and shortness of breath.   Cardiovascular:  Negative for chest pain, palpitations and leg swelling.  Neurological:  Negative for dizziness, syncope, weakness, light-headedness and headaches.     Objective:    Physical Exam Constitutional:      Appearance: He is well-developed. He is obese.  Neck:     Thyroid: No thyromegaly.  Cardiovascular:     Rate and Rhythm: Normal rate and regular rhythm.  Pulmonary:     Effort: Pulmonary effort is normal. No respiratory distress.     Breath sounds: Normal breath sounds. No wheezing or rales.   Musculoskeletal:     Right lower leg: No edema.     Left lower leg: No edema.  Neurological:     Mental Status: He is alert and oriented to person, place, and time.    BP (!) 148/68 (BP Location: Left Arm, Cuff Size: Large)    Pulse (!) 105    Temp 97.6 F (36.4 C) (Oral)    Wt (!) 313 lb 11.2 oz (142.3 kg)    SpO2 96%    BMI  50.63 kg/m  Wt Readings from Last 3 Encounters:  05/21/21 (!) 313 lb 11.2 oz (142.3 kg)  02/17/21 (!) 313 lb 4.8 oz (142.1 kg)  06/19/20 (!) 312 lb 6 oz (141.7 kg)     Health Maintenance Due  Topic Date Due   FOOT EXAM  12/31/2019   COVID-19 Vaccine (3 - Booster for Pfizer series) 04/12/2021    There are no preventive care reminders to display for this patient.  Lab Results  Component Value Date   TSH 4.28 02/17/2021   Lab Results  Component Value Date   WBC 9.1 02/17/2021   HGB 14.2 02/17/2021   HCT 44.0 02/17/2021   MCV 89.4 02/17/2021   PLT 296.0 02/17/2021   Lab Results  Component Value Date   NA 140 02/17/2021   K 4.6 02/17/2021   CO2 29 02/17/2021   GLUCOSE 122 (H) 02/17/2021   BUN 19 02/17/2021   CREATININE 0.84 02/17/2021   BILITOT 0.4 02/17/2021   ALKPHOS 59 02/17/2021   AST 11 02/17/2021   ALT 13 02/17/2021   PROT 6.9 02/17/2021   ALBUMIN 4.1 02/17/2021   CALCIUM 9.2 02/17/2021   GFR 90.85 02/17/2021   Lab Results  Component Value Date   CHOL 125 02/17/2021   Lab Results  Component Value Date   HDL 41.60 02/17/2021   Lab Results  Component Value Date   LDLCALC 55 02/17/2021   Lab Results  Component Value Date   TRIG 142.0 02/17/2021   Lab Results  Component Value Date   CHOLHDL 3 02/17/2021   Lab Results  Component Value Date   HGBA1C 7.3 (A) 05/21/2021      Assessment & Plan:   #1 type 2 diabetes poorly controlled with A1c 7.3%.  We would like to see tighter control of his A1c and also some weight loss.  We did discuss possible trial of Mounjaro.  He had mild nausea with Trulicity but we think this  would be worth giving another try if we can get this covered by his insurance.  -Start Mounjaro 2.5 mg subcutaneous once weekly for 4 weeks.  Give feedback in 1 month.  If tolerating this dosage will titrate further at that point to 5 mg daily.  Reassess A1c in 3 months  #2 hypertension suboptimally controlled.  Increase lisinopril to 20 mg daily.  He has several 10 mg tablets currently we will double up those until they run out and then notify us for refills of the 20 mg.  #3 morbid obesity.  We have discussed previously the fact that Paxil may be contributing somewhat.  Would also like to see him get down to lower doses of insulin if possible by addressing possible GLP-1 / GLP agonists as above   Meds ordered this encounter  Medications   tirzepatide (MOUNJARO) 2.5 MG/0.5ML Pen    Sig: Start 2.5 mg [place] once weekly.    Dispense:  2 mL    Refill:  2    Follow-up: Return in about 3 months (around 08/18/2021).    Carolann Littler, MD

## 2021-05-21 NOTE — Patient Instructions (Signed)
Increase the Lisinopril to 20 mg daily  Start the Mounjaro 2.5 mg [place] once weekly  Let me know if tolerating well in one month so that we can increase dose.

## 2021-05-28 ENCOUNTER — Ambulatory Visit: Payer: Medicare Other | Admitting: Family Medicine

## 2021-05-28 ENCOUNTER — Other Ambulatory Visit: Payer: Self-pay | Admitting: Family Medicine

## 2021-05-28 ENCOUNTER — Encounter: Payer: Self-pay | Admitting: Family Medicine

## 2021-05-28 ENCOUNTER — Ambulatory Visit (INDEPENDENT_AMBULATORY_CARE_PROVIDER_SITE_OTHER): Payer: Medicare Other | Admitting: Family Medicine

## 2021-05-28 VITALS — BP 140/80 | HR 91 | Temp 98.1°F | Ht 66.0 in | Wt 311.5 lb

## 2021-05-28 DIAGNOSIS — J029 Acute pharyngitis, unspecified: Secondary | ICD-10-CM | POA: Diagnosis not present

## 2021-05-28 DIAGNOSIS — J020 Streptococcal pharyngitis: Secondary | ICD-10-CM

## 2021-05-28 LAB — POCT RAPID STREP A (OFFICE): Rapid Strep A Screen: POSITIVE — AB

## 2021-05-28 MED ORDER — AMOXICILLIN 875 MG PO TABS: 875.0000 mg | ORAL_TABLET | Freq: Two times a day (BID) | ORAL | 0 refills | Status: AC

## 2021-05-28 NOTE — Progress Notes (Signed)
Established Patient Office Visit  Subjective:  Patient ID: Tyler Andrews, male    DOB: [birth year]  Age: 66 y.o. MRN: [id number]  CC:  Chief Complaint  Patient presents with   Sore Throat    Patient complains of sore throat, x2 weeks, Tried Nyquil, Tylenol, Ibuprofen with not relief     HPI Tyler Andrews presents for sore throat symptoms.  Intermittently past 2 weeks.  Tyler Andrews was just diagnosed with strep pharyngitis.  Tyler Andrews denies any fever or chills.  No cough.  No nasal congestion.  He has had some lymphadenopathy.  No skin rashes.  No nausea or vomiting.  Type 2 diabetes.  Recent A1c 7.3%.  Been struggling to lose weight.  We were able to get him started recently Mounjaro 2.5 mg daily with plans to titrate if tolerated in 1 month to 5 mg  Past Medical History:  Diagnosis Date   Chicken pox    Depression    Diabetes mellitus without complication ([place])    Hypertension    Urinary tract infection     Past Surgical History:  Procedure Laterality Date   COLON SURGERY  02/13/2018   Dr. Celene Skeen in [place]  10/2017   Right knee, tendon rupture    Family History  Problem Relation Age of Onset   Diabetes Father    Heart disease Father 16       CAD   Cancer Maternal Grandfather        prostate    Social History   Socioeconomic History   Marital status: Married    Spouse name: Not on file   Number of children: Not on file   Years of education: Not on file   Highest education level: Associate degree: occupational, Hotel manager, or vocational program  Occupational History   Not on file  Tobacco Use   Smoking status: Never   Smokeless tobacco: Never  Vaping Use   Vaping Use: Never used  Substance and Sexual Activity   Alcohol use: No   Drug use: No   Sexual activity: Not on file  Other Topics Concern   Not on file  Social History Narrative   Not on file   Social Determinants of Health   Financial Resource Strain: Low Risk     Difficulty of Paying Living Expenses: Not very hard  Food Insecurity: No Food Insecurity   Worried About Running Out of Food in the Last Year: Never true   [place] in the Last Year: Never true  Transportation Needs: No Transportation Needs   Lack of Transportation (Medical): No   Lack of Transportation (Non-Medical): No  Physical Activity: Sufficiently Active   Days of Exercise per Week: 3 days   Minutes of Exercise per Session: 50 min  Stress: Stress Concern Present   Feeling of Stress : To some extent  Social Connections: Moderately Integrated   Frequency of Communication with Friends and Family: More than three times a week   Frequency of Social Gatherings with Friends and Family: Three times a week   Attends Religious Services: 1 to 4 times per year   Active Member of Clubs or Organizations: No   Attends Music therapist: Not on file   Marital Status: Married  Human resources officer Violence: Not on file    Outpatient Medications Prior to Visit  Medication Sig Dispense Refill   acyclovir (ZOVIRAX) 800 MG tablet Take 1 tablet (800 mg total) by mouth 2 (two)  times daily. 60 tablet 2   aspirin 81 MG tablet Take 81 mg by mouth daily.     empagliflozin (JARDIANCE) 25 MG TABS tablet Take 1 tablet (25 mg total) by mouth daily before breakfast. 90 tablet 3   fluticasone (FLONASE) 50 MCG/ACT nasal spray Place 2 sprays into both nostrils daily. 16 g 6   insulin glargine, 1 Unit Dial, (TOUJEO SOLOSTAR) 300 UNIT/ML Solostar Pen Inject 70 Units into the skin daily. Inject 90 units into the skin daily. 12 mL 11   insulin lispro (HUMALOG) 100 UNIT/ML KwikPen INJECT 20 UNITS SUBCUTANEOUSLY THREE TIMES DAILY WITH MEALS 15 mL 0   lisinopril (ZESTRIL) 20 MG tablet Take 20 mg by mouth daily.     LORazepam (ATIVAN) 0.5 MG tablet Take 1 tablet (0.5 mg total) by mouth every 8 (eight) hours as needed for anxiety. 30 tablet 0   metFORMIN (GLUCOPHAGE) 500 MG tablet Take 2 tablets (1,000 mg  total) by mouth 2 (two) times daily with a meal. Needs appointment for refills. 360 tablet 3   methocarbamol (ROBAXIN) 500 MG tablet Take 1 tablet (500 mg total) by mouth every 8 (eight) hours as needed for muscle spasms. 20 tablet 0   Misc. Devices MISC Initiate Auto C-pap 5-15 cm with mask and supplies     PARoxetine (PAXIL) 20 MG tablet Take 1 tablet (20 mg total) by mouth daily. 90 tablet 3   simvastatin (ZOCOR) 40 MG tablet Take 1 tablet (40 mg total) by mouth at bedtime. 90 tablet 3   tirzepatide (MOUNJARO) 2.5 MG/0.5ML Pen Start 2.5 mg [place] once weekly. 2 mL 2   No facility-administered medications prior to visit.    Allergies  Allergen Reactions   Trulicity [Dulaglutide] Nausea Only    ROS Review of Systems  Constitutional:  Negative for chills and fever.  HENT:  Positive for sore throat. Negative for congestion.   Respiratory:  Negative for cough and shortness of breath.   Skin:  Negative for rash.     Objective:    Physical Exam Vitals reviewed.  HENT:     Right Ear: Tympanic membrane normal.     Left Ear: Tympanic membrane normal.     Mouth/Throat:     Comments: Does have some mild posterior pharynx erythema.  No exudate noted. Cardiovascular:     Rate and Rhythm: Normal rate.  Pulmonary:     Effort: Pulmonary effort is normal.     Breath sounds: Normal breath sounds. No wheezing or rales.  Musculoskeletal:     Cervical back: Neck supple.  Skin:    Findings: No rash.  Neurological:     Mental Status: He is alert.    BP 140/80 (BP Location: Left Arm, Patient Position: Sitting, Cuff Size: Normal)    Pulse 91    Temp 98.1 F (36.7 C) (Oral)    Ht 5\' 6"  (1.676 m)    Wt (!) 311 lb 8 oz (141.3 kg)    SpO2 94%    BMI 50.28 kg/m  Wt Readings from Last 3 Encounters:  05/28/21 (!) 311 lb 8 oz (141.3 kg)  05/21/21 (!) 313 lb 11.2 oz (142.3 kg)  02/17/21 (!) 313 lb 4.8 oz (142.1 kg)     Health Maintenance Due  Topic Date Due   FOOT EXAM  12/31/2019   COVID-19  Vaccine (3 - Booster for Pfizer series) 04/12/2021    There are no preventive care reminders to display for this patient.  Lab Results  Component Value  Date   TSH 4.28 02/17/2021   Lab Results  Component Value Date   WBC 9.1 02/17/2021   HGB 14.2 02/17/2021   HCT 44.0 02/17/2021   MCV 89.4 02/17/2021   PLT 296.0 02/17/2021   Lab Results  Component Value Date   NA 140 02/17/2021   K 4.6 02/17/2021   CO2 29 02/17/2021   GLUCOSE 122 (H) 02/17/2021   BUN 19 02/17/2021   CREATININE 0.84 02/17/2021   BILITOT 0.4 02/17/2021   ALKPHOS 59 02/17/2021   AST 11 02/17/2021   ALT 13 02/17/2021   PROT 6.9 02/17/2021   ALBUMIN 4.1 02/17/2021   CALCIUM 9.2 02/17/2021   GFR 90.85 02/17/2021   Lab Results  Component Value Date   CHOL 125 02/17/2021   Lab Results  Component Value Date   HDL 41.60 02/17/2021   Lab Results  Component Value Date   LDLCALC 55 02/17/2021   Lab Results  Component Value Date   TRIG 142.0 02/17/2021   Lab Results  Component Value Date   CHOLHDL 3 02/17/2021   Lab Results  Component Value Date   HGBA1C 7.3 (A) 05/21/2021      Assessment & Plan:   Acute sore throat.  Recent exposure to strep pharyngitis as above.  Patient does not have any typical viral symptoms such as nasal congestion or cough.  Check rapid strep-positive  -Start amoxicillin 875 mg by mouth twice daily for 10 days -Short-term use of Advil or Aleve as needed for discomfort -Touch base if symptoms not fully resolving over the next week  No orders of the defined types were placed in this encounter.   Follow-up: No follow-ups on file.    Carolann Littler, MD

## 2021-05-31 ENCOUNTER — Ambulatory Visit: Payer: Medicare Other | Admitting: Family Medicine

## 2021-06-11 ENCOUNTER — Other Ambulatory Visit: Payer: Self-pay | Admitting: Family Medicine

## 2021-06-18 ENCOUNTER — Ambulatory Visit (INDEPENDENT_AMBULATORY_CARE_PROVIDER_SITE_OTHER): Payer: Medicare Other | Admitting: Family Medicine

## 2021-06-18 ENCOUNTER — Encounter: Payer: Self-pay | Admitting: Family Medicine

## 2021-06-18 VITALS — BP 118/60 | HR 70 | Temp 97.6°F | Ht 66.0 in | Wt 312.5 lb

## 2021-06-18 DIAGNOSIS — E1165 Type 2 diabetes mellitus with hyperglycemia: Secondary | ICD-10-CM | POA: Diagnosis not present

## 2021-06-18 DIAGNOSIS — J020 Streptococcal pharyngitis: Secondary | ICD-10-CM

## 2021-06-18 DIAGNOSIS — K219 Gastro-esophageal reflux disease without esophagitis: Secondary | ICD-10-CM | POA: Diagnosis not present

## 2021-06-18 DIAGNOSIS — J029 Acute pharyngitis, unspecified: Secondary | ICD-10-CM

## 2021-06-18 LAB — POCT RAPID STREP A (OFFICE): Rapid Strep A Screen: POSITIVE — AB

## 2021-06-18 MED ORDER — CEPHALEXIN 500 MG PO CAPS: 500.0000 mg | ORAL_CAPSULE | Freq: Four times a day (QID) | ORAL | 0 refills | Status: DC

## 2021-06-18 MED ORDER — TIRZEPATIDE 5 MG/0.5ML ~~LOC~~ SOAJ: 5.0000 mg | SUBCUTANEOUS | 5 refills | Status: DC

## 2021-06-18 NOTE — Progress Notes (Signed)
? ?Established Patient Office Visit ? ?Subjective:  ?Patient ID: Tyler Andrews, male    DOB: 24-Dec-1955  Age: 66 y.o. MRN: [id number] ? ?CC:  ?Chief Complaint  ?Patient presents with  ? Sore Throat  ? ? ?HPI ?Tyler Andrews presents for the following items. ? ?Was seen back in late February with sore throat.  Exposure to strep and grandson.  His strep test came back positive though he had no fever.  He was treated with amoxicillin and after just 1 day of antibiotics his symptoms seem better.  He started having recurrent sore throat symptoms about 5 days ago.  No fever.  No headaches.  No body aches.  No nasal congestion.  No skin rash. ? ?Does have some postnasal drip and still has some reflux symptoms which are generally fairly well controlled with over-the-counter medications.  No dysphagia.  He had one episode where he states he "gagged "and a small amount of blood streaked mucus came up but none since then.  No pain with swallowing.  No appetite changes.  No weight changes. ? ?Type 2 diabetes.  Recently started Mounjaro 2.5 mg subcutaneous once weekly.  He had some very mild nausea but tolerating fairly well.  He plans to start exercising more consistently but currently very inconsistent.  He has follow-up in May.  Last A1c 7.3% ? ?Wt Readings from Last 3 Encounters:  ?06/18/21 (!) 312 lb 8 oz (141.7 kg)  ?05/28/21 (!) 311 lb 8 oz (141.3 kg)  ?05/21/21 (!) 313 lb 11.2 oz (142.3 kg)  ? ? ? ?Past Medical History:  ?Diagnosis Date  ? Chicken pox   ? Depression   ? Diabetes mellitus without complication ([place])   ? Hypertension   ? Urinary tract infection   ? ? ?Past Surgical History:  ?Procedure Laterality Date  ? COLON SURGERY  02/13/2018  ? Dr. Celene Skeen in [place]  ? KNEE SURGERY  10/2017  ? Right knee, tendon rupture  ? ? ?Family History  ?Problem Relation Age of Onset  ? Diabetes Father   ? Heart disease Father 39  ?     CAD  ? Cancer Maternal Grandfather   ?     prostate  ? ? ?Social History   ? ?Socioeconomic History  ? Marital status: Married  ?  Spouse name: Not on file  ? Number of children: Not on file  ? Years of education: Not on file  ? Highest education level: Associate degree: occupational, Hotel manager, or vocational program  ?Occupational History  ? Not on file  ?Tobacco Use  ? Smoking status: Never  ? Smokeless tobacco: Never  ?Vaping Use  ? Vaping Use: Never used  ?Substance and Sexual Activity  ? Alcohol use: No  ? Drug use: No  ? Sexual activity: Not on file  ?Other Topics Concern  ? Not on file  ?Social History Narrative  ? Not on file  ? ?Social Determinants of Health  ? ?Financial Resource Strain: Low Risk   ? Difficulty of Paying Living Expenses: Not very hard  ?Food Insecurity: No Food Insecurity  ? Worried About Charity fundraiser in the Last Year: Never true  ? Ran Out of Food in the Last Year: Never true  ?Transportation Needs: No Transportation Needs  ? Lack of Transportation (Medical): No  ? Lack of Transportation (Non-Medical): No  ?Physical Activity: Sufficiently Active  ? Days of Exercise per Week: 3 days  ? Minutes of Exercise per Session: 50 min  ?  Stress: Stress Concern Present  ? Feeling of Stress : To some extent  ?Social Connections: Moderately Integrated  ? Frequency of Communication with Friends and Family: More than three times a week  ? Frequency of Social Gatherings with Friends and Family: Three times a week  ? Attends Religious Services: 1 to 4 times per year  ? Active Member of Clubs or Organizations: No  ? Attends Archivist Meetings: Not on file  ? Marital Status: Married  ?Intimate Partner Violence: Not on file  ? ? ?Outpatient Medications Prior to Visit  ?Medication Sig Dispense Refill  ? acyclovir (ZOVIRAX) 800 MG tablet Take 1 tablet (800 mg total) by mouth 2 (two) times daily. 60 tablet 2  ? aspirin 81 MG tablet Take 81 mg by mouth daily.    ? empagliflozin (JARDIANCE) 25 MG TABS tablet Take 1 tablet (25 mg total) by mouth daily before breakfast.  90 tablet 3  ? fluticasone (FLONASE) 50 MCG/ACT nasal spray Place 2 sprays into both nostrils daily. 16 g 6  ? insulin glargine, 1 Unit Dial, (TOUJEO SOLOSTAR) 300 UNIT/ML Solostar Pen INJECT 90 UNITS INTO THE SKIN ONCE DAILY 12 mL 1  ? insulin lispro (HUMALOG) 100 UNIT/ML KwikPen INJECT 20 UNITS SUBCUTANEOUSLY THREE TIMES DAILY WITH MEALS 15 mL 0  ? lisinopril (ZESTRIL) 20 MG tablet Take 20 mg by mouth daily.    ? LORazepam (ATIVAN) 0.5 MG tablet Take 1 tablet (0.5 mg total) by mouth every 8 (eight) hours as needed for anxiety. 30 tablet 0  ? metFORMIN (GLUCOPHAGE) 500 MG tablet Take 2 tablets (1,000 mg total) by mouth 2 (two) times daily with a meal. Needs appointment for refills. 360 tablet 3  ? methocarbamol (ROBAXIN) 500 MG tablet Take 1 tablet (500 mg total) by mouth every 8 (eight) hours as needed for muscle spasms. 20 tablet 0  ? Misc. Devices MISC Initiate Auto C-pap 5-15 cm with mask and supplies    ? PARoxetine (PAXIL) 20 MG tablet Take 1 tablet (20 mg total) by mouth daily. 90 tablet 3  ? simvastatin (ZOCOR) 40 MG tablet Take 1 tablet (40 mg total) by mouth at bedtime. 90 tablet 3  ? tirzepatide [facility]) 2.5 MG/0.5ML Pen Start 2.5 mg [place] once weekly. 2 mL 2  ? ?No facility-administered medications prior to visit.  ? ? ?Allergies  ?Allergen Reactions  ? Trulicity [Dulaglutide] Nausea Only  ? ? ?ROS ?Review of Systems  ?Constitutional:  Negative for chills and fever.  ?HENT:  Positive for sore throat. Negative for congestion.   ?Respiratory:  Negative for cough and shortness of breath.   ?Cardiovascular:  Negative for chest pain.  ? ?  ?Objective:  ?  ?Physical Exam ?Vitals reviewed.  ?Constitutional:   ?   Appearance: He is well-developed. He is not ill-appearing or toxic-appearing.  ?HENT:  ?   Mouth/Throat:  ?   Comments: Oropharynx is moist.  Minimal posterior edema bilaterally.  No exudate.  No tonsillar asymmetry.  No ulcerations. ?Cardiovascular:  ?   Rate and Rhythm: Normal rate and regular  rhythm.  ?Pulmonary:  ?   Effort: Pulmonary effort is normal.  ?   Breath sounds: Normal breath sounds.  ?Musculoskeletal:  ?   Cervical back: Neck supple.  ?Skin: ?   Findings: No rash.  ?Neurological:  ?   Mental Status: He is alert.  ? ? ?BP 118/60 (BP Location: Left Arm, Patient Position: Sitting, Cuff Size: Normal)   Pulse 70   Temp  97.6 ?F (36.4 ?C) (Oral)   Ht '5\' 6"'$  (1.676 m)   Wt (!) 312 lb 8 oz (141.7 kg)   SpO2 96%   BMI 50.44 kg/m?  ?Wt Readings from Last 3 Encounters:  ?06/18/21 (!) 312 lb 8 oz (141.7 kg)  ?05/28/21 (!) 311 lb 8 oz (141.3 kg)  ?05/21/21 (!) 313 lb 11.2 oz (142.3 kg)  ? ? ? ?Health Maintenance Due  ?Topic Date Due  ? FOOT EXAM  12/31/2019  ? COVID-19 Vaccine (3 - Booster for Pfizer series) 04/12/2021  ? ? ?There are no preventive care reminders to display for this patient. ? ?Lab Results  ?Component Value Date  ? TSH 4.28 02/17/2021  ? ?Lab Results  ?Component Value Date  ? WBC 9.1 02/17/2021  ? HGB 14.2 02/17/2021  ? HCT 44.0 02/17/2021  ? MCV 89.4 02/17/2021  ? PLT 296.0 02/17/2021  ? ?Lab Results  ?Component Value Date  ? NA 140 02/17/2021  ? K 4.6 02/17/2021  ? CO2 29 02/17/2021  ? GLUCOSE 122 (H) 02/17/2021  ? BUN 19 02/17/2021  ? CREATININE 0.84 02/17/2021  ? BILITOT 0.4 02/17/2021  ? ALKPHOS 59 02/17/2021  ? AST 11 02/17/2021  ? ALT 13 02/17/2021  ? PROT 6.9 02/17/2021  ? ALBUMIN 4.1 02/17/2021  ? CALCIUM 9.2 02/17/2021  ? GFR 90.85 02/17/2021  ? ?Lab Results  ?Component Value Date  ? CHOL 125 02/17/2021  ? ?Lab Results  ?Component Value Date  ? HDL 41.60 02/17/2021  ? ?Lab Results  ?Component Value Date  ? [place] 55 02/17/2021  ? ?Lab Results  ?Component Value Date  ? TRIG 142.0 02/17/2021  ? ?Lab Results  ?Component Value Date  ? CHOLHDL 3 02/17/2021  ? ?Lab Results  ?Component Value Date  ? HGBA1C 7.3 (A) 05/21/2021  ? ? ?  ?Assessment & Plan:  ? ?#1 recurrent sore throat.  Rapid strep faintly positive.  He had strep pharyngitis back in February which was treated with  amoxicillin.  He did complete that course of antibiotics and seem to be fully recovered and to well 5 days ago.  Will treat with Keflex 500 mg 4 times daily for 10 days.  Be in touch for any recurrent symptoms ? ?#2 type 2 diab

## 2021-06-23 ENCOUNTER — Encounter: Payer: Self-pay | Admitting: Family Medicine

## 2021-06-24 ENCOUNTER — Other Ambulatory Visit: Payer: Self-pay | Admitting: Family Medicine

## 2021-06-25 ENCOUNTER — Ambulatory Visit (INDEPENDENT_AMBULATORY_CARE_PROVIDER_SITE_OTHER): Payer: Medicare Other | Admitting: Family Medicine

## 2021-06-25 ENCOUNTER — Encounter: Payer: Self-pay | Admitting: Family Medicine

## 2021-06-25 VITALS — BP 126/60 | HR 87 | Temp 97.5°F | Ht 66.0 in | Wt 312.5 lb

## 2021-06-25 DIAGNOSIS — J029 Acute pharyngitis, unspecified: Secondary | ICD-10-CM

## 2021-06-25 NOTE — Patient Instructions (Signed)
We will call with throat culture results ? ?Consider OTC Nexium or Prilosec 20 mg daily.    ? ?Let me know if not improving by next week ? ?We might need to consider ENT referral.   ?

## 2021-06-25 NOTE — Progress Notes (Signed)
? ?Established Patient Office Visit ? ?Subjective:  ?Patient ID: Tyler Andrews, male    DOB: Andrews 26, 1957  Age: 66 y.o. MRN: [id number] ? ?CC:  ?Chief Complaint  ?Patient presents with  ? Follow-up  ? ? ?HPI ?Tyler Andrews presents for recurrent sore throat.  Refer to recent notes for details.  He was seen in February with sore throat and had been around his grandson who had known strep.  Patient's test came back positive even though he had no fever at that time.  He was treated with amoxicillin and his symptoms promptly improved but then recurred.  He was seen here on 17 March and had repeat strep test which came back positive.  Started on Keflex which she remains on at this time.  He states after taking the Keflex for couple days he has sore throat symptoms almost resolved until 2 days ago on Wednesday.  No significant nasal congestion symptoms.  No fever.  No skin rash. ? ?He recently started [facility] for diabetes and we explained we did not think this was related to that.  Not aware of any neck adenopathy.  No appetite or weight changes. ? ?Past Medical History:  ?Diagnosis Date  ? Chicken pox   ? Depression   ? Diabetes mellitus without complication ([place])   ? Hypertension   ? Urinary tract infection   ? ? ?Past Surgical History:  ?Procedure Laterality Date  ? COLON SURGERY  02/13/2018  ? Dr. Celene Skeen in [place]  ? KNEE SURGERY  10/2017  ? Right knee, tendon rupture  ? ? ?Family History  ?Problem Relation Age of Onset  ? Diabetes Father   ? Heart disease Father 71  ?     CAD  ? Cancer Maternal Grandfather   ?     prostate  ? ? ?Social History  ? ?Socioeconomic History  ? Marital status: Married  ?  Spouse name: Not on file  ? Number of children: Not on file  ? Years of education: Not on file  ? Highest education level: Associate degree: occupational, Hotel manager, or vocational program  ?Occupational History  ? Not on file  ?Tobacco Use  ? Smoking status: Never  ? Smokeless tobacco: Never  ?Vaping Use  ?  Vaping Use: Never used  ?Substance and Sexual Activity  ? Alcohol use: No  ? Drug use: No  ? Sexual activity: Not on file  ?Other Topics Concern  ? Not on file  ?Social History Narrative  ? Not on file  ? ?Social Determinants of Health  ? ?Financial Resource Strain: Low Risk   ? Difficulty of Paying Living Expenses: Not very hard  ?Food Insecurity: No Food Insecurity  ? Worried About Charity fundraiser in the Last Year: Never true  ? Ran Out of Food in the Last Year: Never true  ?Transportation Needs: No Transportation Needs  ? Lack of Transportation (Medical): No  ? Lack of Transportation (Non-Medical): No  ?Physical Activity: Sufficiently Active  ? Days of Exercise per Week: 3 days  ? Minutes of Exercise per Session: 50 min  ?Stress: Stress Concern Present  ? Feeling of Stress : To some extent  ?Social Connections: Moderately Integrated  ? Frequency of Communication with Friends and Family: More than three times a week  ? Frequency of Social Gatherings with Friends and Family: Three times a week  ? Attends Religious Services: 1 to 4 times per year  ? Active Member of Clubs or Organizations: No  ?  Attends Archivist Meetings: Not on file  ? Marital Status: Married  ?Intimate Partner Violence: Not on file  ? ? ?Outpatient Medications Prior to Visit  ?Medication Sig Dispense Refill  ? acyclovir (ZOVIRAX) 800 MG tablet Take 1 tablet (800 mg total) by mouth 2 (two) times daily. 60 tablet 2  ? aspirin 81 MG tablet Take 81 mg by mouth daily.    ? cephALEXin (KEFLEX) 500 MG capsule Take 1 capsule (500 mg total) by mouth 4 (four) times daily. 40 capsule 0  ? empagliflozin (JARDIANCE) 25 MG TABS tablet Take 1 tablet (25 mg total) by mouth daily before breakfast. 90 tablet 3  ? fluticasone (FLONASE) 50 MCG/ACT nasal spray Place 2 sprays into both nostrils daily. 16 g 6  ? insulin glargine, 1 Unit Dial, (TOUJEO SOLOSTAR) 300 UNIT/ML Solostar Pen INJECT 90 UNITS INTO THE SKIN ONCE DAILY 12 mL 1  ? lisinopril  (ZESTRIL) 20 MG tablet Take 20 mg by mouth daily.    ? LORazepam (ATIVAN) 0.5 MG tablet Take 1 tablet (0.5 mg total) by mouth every 8 (eight) hours as needed for anxiety. 30 tablet 0  ? metFORMIN (GLUCOPHAGE) 500 MG tablet Take 2 tablets (1,000 mg total) by mouth 2 (two) times daily with a meal. Needs appointment for refills. 360 tablet 3  ? methocarbamol (ROBAXIN) 500 MG tablet Take 1 tablet (500 mg total) by mouth every 8 (eight) hours as needed for muscle spasms. 20 tablet 0  ? Misc. Devices MISC Initiate Auto C-pap 5-15 cm with mask and supplies    ? PARoxetine (PAXIL) 20 MG tablet Take 1 tablet (20 mg total) by mouth daily. 90 tablet 3  ? simvastatin (ZOCOR) 40 MG tablet Take 1 tablet (40 mg total) by mouth at bedtime. 90 tablet 3  ? tirzepatide [facility]) 5 MG/0.5ML Pen Inject 5 mg into the skin once a week. 2 mL 5  ? insulin lispro (HUMALOG) 100 UNIT/ML KwikPen INJECT 20 UNITS SUBCUTANEOUSLY THREE TIMES DAILY WITH MEALS 15 mL 0  ? ?No facility-administered medications prior to visit.  ? ? ?Allergies  ?Allergen Reactions  ? Trulicity [Dulaglutide] Nausea Only  ? ? ?ROS ?Review of Systems  ?Constitutional:  Negative for chills and fever.  ?HENT:  Positive for sore throat.   ?Respiratory:  Negative for shortness of breath.   ?Cardiovascular:  Negative for chest pain.  ?Skin:  Negative for rash.  ?Hematological:  Negative for adenopathy.  ? ?  ?Objective:  ?  ?Physical Exam ?Vitals reviewed.  ?Constitutional:   ?   Appearance: Normal appearance.  ?HENT:  ?   Right Ear: Tympanic membrane normal.  ?   Left Ear: Tympanic membrane normal.  ?   Mouth/Throat:  ?   Comments: Oropharynx reveals healthy appearing mucosa.  No noted increased erythema.  No exudate.  No ulcers.  Tonsils appear symmetric.  No necrosis. ?Pulmonary:  ?   Effort: Pulmonary effort is normal.  ?   Breath sounds: Normal breath sounds.  ?Musculoskeletal:  ?   Cervical back: Neck supple.  ?Lymphadenopathy:  ?   Cervical: No cervical adenopathy.   ?Skin: ?   Findings: No rash.  ?Neurological:  ?   Mental Status: He is alert.  ? ? ?BP 126/60 (BP Location: Left Arm, Patient Position: Sitting, Cuff Size: Large)   Pulse 87   Temp (!) 97.5 ?F (36.4 ?C) (Oral)   Ht '5\' 6"'$  (1.676 m)   Wt (!) 312 lb 8 oz (141.7 kg)  SpO2 96%   BMI 50.44 kg/m?  ?Wt Readings from Last 3 Encounters:  ?06/25/21 (!) 312 lb 8 oz (141.7 kg)  ?06/18/21 (!) 312 lb 8 oz (141.7 kg)  ?05/28/21 (!) 311 lb 8 oz (141.3 kg)  ? ? ? ?Health Maintenance Due  ?Topic Date Due  ? FOOT EXAM  12/31/2019  ? COVID-19 Vaccine (3 - Booster for Pfizer series) 04/12/2021  ? ? ?There are no preventive care reminders to display for this patient. ? ?Lab Results  ?Component Value Date  ? TSH 4.28 02/17/2021  ? ?Lab Results  ?Component Value Date  ? WBC 9.1 02/17/2021  ? HGB 14.2 02/17/2021  ? HCT 44.0 02/17/2021  ? MCV 89.4 02/17/2021  ? PLT 296.0 02/17/2021  ? ?Lab Results  ?Component Value Date  ? NA 140 02/17/2021  ? K 4.6 02/17/2021  ? CO2 29 02/17/2021  ? GLUCOSE 122 (H) 02/17/2021  ? BUN 19 02/17/2021  ? CREATININE 0.84 02/17/2021  ? BILITOT 0.4 02/17/2021  ? ALKPHOS 59 02/17/2021  ? AST 11 02/17/2021  ? ALT 13 02/17/2021  ? PROT 6.9 02/17/2021  ? ALBUMIN 4.1 02/17/2021  ? CALCIUM 9.2 02/17/2021  ? GFR 90.85 02/17/2021  ? ?Lab Results  ?Component Value Date  ? CHOL 125 02/17/2021  ? ?Lab Results  ?Component Value Date  ? HDL 41.60 02/17/2021  ? ?Lab Results  ?Component Value Date  ? [place] 55 02/17/2021  ? ?Lab Results  ?Component Value Date  ? TRIG 142.0 02/17/2021  ? ?Lab Results  ?Component Value Date  ? CHOLHDL 3 02/17/2021  ? ?Lab Results  ?Component Value Date  ? HGBA1C 7.3 (A) 05/21/2021  ? ? ?  ?Assessment & Plan:  ? ?Problem List Items Addressed This Visit   ?None ?Visit Diagnoses   ? ? Sore throat    -  Primary  ? Relevant Orders  ? Culture, Group A Strep  ? ?  ?Patient presents with recurrent sore throat in the setting of 2 recent positive strep test treated initially with amoxicillin and  subsequently Keflex.  Obtain throat culture to help further clarify.  If throat culture negative on Keflex and symptoms not improving consider ENT referral. ?-We also discussed empiric trial of over-the-counter PPI Nexium o

## 2021-06-27 LAB — CULTURE, GROUP A STREP
MICRO NUMBER:: 13176610
SPECIMEN QUALITY:: ADEQUATE

## 2021-06-28 ENCOUNTER — Telehealth: Payer: Self-pay | Admitting: Family Medicine

## 2021-06-28 NOTE — Telephone Encounter (Signed)
Patient and spouse called in to return Tyler Andrews's call regarding results. ? ?Please advise. ?

## 2021-06-28 NOTE — Telephone Encounter (Signed)
Please see result note 

## 2021-07-15 ENCOUNTER — Encounter: Payer: Self-pay | Admitting: Family Medicine

## 2021-07-15 NOTE — Telephone Encounter (Signed)
Please advise 

## 2021-07-16 ENCOUNTER — Ambulatory Visit: Payer: Medicare Other

## 2021-07-18 MED ORDER — TIRZEPATIDE 7.5 MG/0.5ML ~~LOC~~ SOAJ: 7.5000 mg | SUBCUTANEOUS | 1 refills | Status: DC

## 2021-07-18 NOTE — Telephone Encounter (Signed)
I will go ahead and send in the Mounjaro 7.5 mg dose. ?

## 2021-07-19 ENCOUNTER — Other Ambulatory Visit: Payer: Self-pay | Admitting: Family Medicine

## 2021-07-23 DIAGNOSIS — J0301 Acute recurrent streptococcal tonsillitis: Secondary | ICD-10-CM | POA: Diagnosis not present

## 2021-07-23 DIAGNOSIS — K219 Gastro-esophageal reflux disease without esophagitis: Secondary | ICD-10-CM | POA: Diagnosis not present

## 2021-08-13 ENCOUNTER — Encounter: Payer: Self-pay | Admitting: Family Medicine

## 2021-08-13 ENCOUNTER — Ambulatory Visit (INDEPENDENT_AMBULATORY_CARE_PROVIDER_SITE_OTHER): Payer: Medicare Other | Admitting: Family Medicine

## 2021-08-13 VITALS — BP 136/60 | HR 84 | Temp 97.6°F | Ht 66.0 in | Wt 308.6 lb

## 2021-08-13 DIAGNOSIS — E1165 Type 2 diabetes mellitus with hyperglycemia: Secondary | ICD-10-CM | POA: Diagnosis not present

## 2021-08-13 DIAGNOSIS — R300 Dysuria: Secondary | ICD-10-CM

## 2021-08-13 LAB — POC URINALSYSI DIPSTICK (AUTOMATED)
Bilirubin, UA: NEGATIVE
Blood, UA: NEGATIVE
Glucose, UA: POSITIVE — AB
Ketones, UA: NEGATIVE
Leukocytes, UA: NEGATIVE
Nitrite, UA: NEGATIVE
Protein, UA: NEGATIVE
Spec Grav, UA: 1.02 (ref 1.010–1.025)
Urobilinogen, UA: 0.2 E.U./dL
pH, UA: 6 (ref 5.0–8.0)

## 2021-08-13 LAB — POCT GLYCOSYLATED HEMOGLOBIN (HGB A1C): Hemoglobin A1C: 6.3 % — AB (ref 4.0–5.6)

## 2021-08-13 NOTE — Patient Instructions (Signed)
A1C has improved to 6.3% ? ?We have to watch to make sure not dropping sugars too low with the weight loss/Mounjaro ? ? ?

## 2021-08-13 NOTE — Progress Notes (Signed)
? ?Established Patient Office Visit ? ?Subjective   ?Patient ID: Tyler Andrews, male    DOB: 12-Apr-1955  Age: 66 y.o. MRN: [id number] ? ?Chief Complaint  ?Patient presents with  ? Urinary Tract Infection  ? Dysuria  ?  Patient complains of dysuria, x4 days   ? Abdominal Pain  ? ? ?HPI ? ? ?Seen with onset Monday of this week of some intermittent burning with urination.  He had some leftover antibiotic with amoxicillin and apparently Keflex and took a couple doses of that.  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  He did also start acyclovir recently.  He thinks he may have had some fever Monday but none since then.  No flank pain.  No nausea or vomiting. ? ?Type 2 diabetes.  Recently started on Mounjaro.  Currently taking 7.5 mg and tolerating fairly well.  Blood sugars are improving.  Only 1 or 2 mild hypoglycemic symptoms.  He is on high-dose insulin with Toujeo 90 units daily and Humalog 20 units 3 times daily. ? ?Past Medical History:  ?Diagnosis Date  ? Chicken pox   ? Depression   ? Diabetes mellitus without complication ([place])   ? Hypertension   ? Urinary tract infection   ? ?Past Surgical History:  ?Procedure Laterality Date  ? COLON SURGERY  02/13/2018  ? Dr. Celene Skeen in [place]  ? KNEE SURGERY  10/2017  ? Right knee, tendon rupture  ? ? reports that he has never smoked. He has never used smokeless tobacco. He reports that he does not drink alcohol and does not use drugs. ?family history includes Cancer in his maternal grandfather; Diabetes in his father; Heart disease (age of onset: 83) in his father. ?Allergies  ?Allergen Reactions  ? Trulicity [Dulaglutide] Nausea Only  ? ? ?Review of Systems  ?Constitutional:   ?     See HPI  ?Respiratory:  Negative for shortness of breath.   ?Cardiovascular:  Negative for chest pain.  ?Genitourinary:  Positive for dysuria. Negative for flank pain and hematuria.  ? ?  ?Objective:  ?  ? ?BP 136/60 (BP Location: Left Arm,  Patient Position: Sitting, Cuff Size: Normal)   Pulse 84   Temp 97.6 ?F (36.4 ?C) (Oral)   Ht '5\' 6"'$  (1.676 m)   Wt (!) 308 lb 9.6 oz (140 kg)   SpO2 98%   BMI 49.81 kg/m?  ? ? ?Physical Exam ?Vitals reviewed.  ?Constitutional:   ?   General: He is not in acute distress. ?   Appearance: He is not ill-appearing.  ?Cardiovascular:  ?   Rate and Rhythm: Normal rate and regular rhythm.  ?Pulmonary:  ?   Effort: Pulmonary effort is normal.  ?   Breath sounds: Normal breath sounds.  ?Neurological:  ?   Mental Status: He is alert.  ? ? ? ?Results for orders placed or performed in visit on 08/13/21  ?POCT Urinalysis Dipstick (Automated)  ?Result Value Ref Range  ? Color, UA yellow   ? Clarity, UA clear   ? Glucose, UA Positive (A) Negative  ? Bilirubin, UA negative   ? Ketones, UA negative   ? Spec Grav, UA 1.020 1.010 - 1.025  ? Blood, UA negative   ? pH, UA 6.0 5.0 - 8.0  ? Protein, UA Negative Negative  ? Urobilinogen, UA 0.2 0.2 or 1.0 E.U./dL  ? Nitrite, UA negative   ? Leukocytes, UA Negative Negative  ?POCT  glycosylated hemoglobin (Hb A1C)  ?Result Value Ref Range  ? Hemoglobin A1C 6.3 (A) 4.0 - 5.6 %  ? HbA1c POC (<> result, manual entry)    ? HbA1c, POC (prediabetic range)    ? HbA1c, POC (controlled diabetic range)    ? ? ? ? ?The ASCVD Risk score (Arnett DK, et al., 2019) failed to calculate for the following reasons: ?  The valid total cholesterol range is 130 to 320 mg/dL ? ?  ?Assessment & Plan:  ? ?Problem List Items Addressed This Visit   ? ?  ? Unprioritized  ? Type 2 diabetes mellitus with hyperglycemia (H[facility])  ? Relevant Orders  ? POCT glycosylated hemoglobin (Hb A1C) (Completed)  ? ?Other Visit Diagnoses   ? ? Dysuria    -  Primary  ? Relevant Orders  ? POCT Urinalysis Dipstick (Automated) (Completed)  ? Urine Culture  ? ?  ?Urine dipstick reveals glucosuria which is expected with his Jardiance therapy.  No leukocytes or nitrites. ?We will send urine culture to be safe.  We did discuss other possible  etiologies such as prostatitis.  He declined prostate exam.  Wait on culture. ? ?Blood sugars improving with A1c today 6.3% which is down from previous of 7.3%.  We explained that we will likely need to reduce his insulin with improving control.  We have suggested he consider reducing Humalog to 15 units 3 times daily with meals and may need to reduce even further. ?Set up 6-monthfollow-up.  If A1c is declining further at that point consider further reduction in Toujeo as well. ? ?Return in about 3 months (around 11/13/2021).  ? ? ?BCarolann Littler MD ? ?

## 2021-08-14 LAB — URINE CULTURE
MICRO NUMBER:: 13389027
Result:: NO GROWTH
SPECIMEN QUALITY:: ADEQUATE

## 2021-08-16 ENCOUNTER — Encounter: Payer: Self-pay | Admitting: Family Medicine

## 2021-08-16 ENCOUNTER — Ambulatory Visit (INDEPENDENT_AMBULATORY_CARE_PROVIDER_SITE_OTHER): Payer: Medicare Other | Admitting: Family Medicine

## 2021-08-16 VITALS — BP 140/60 | HR 93 | Temp 97.7°F | Ht 66.0 in | Wt 309.3 lb

## 2021-08-16 DIAGNOSIS — R300 Dysuria: Secondary | ICD-10-CM

## 2021-08-16 MED ORDER — CIPROFLOXACIN HCL 500 MG PO TABS: 500.0000 mg | ORAL_TABLET | Freq: Two times a day (BID) | ORAL | 0 refills | Status: DC

## 2021-08-16 MED ORDER — TAMSULOSIN HCL 0.4 MG PO CAPS: 0.4000 mg | ORAL_CAPSULE | Freq: Every day | ORAL | 0 refills | Status: DC

## 2021-08-16 NOTE — Patient Instructions (Signed)
Let me know if symptoms not resolving with the Flomax and the Cipro.  ?

## 2021-08-16 NOTE — Progress Notes (Signed)
? ?Established Patient Office Visit ? ?Subjective   ?Patient ID: Tyler Andrews, male    DOB: 1955/08/26  Age: 66 y.o. MRN: [id number] ? ?Chief Complaint  ?Patient presents with  ? Dysuria  ? ? ?HPI ? ? ?Check is seen for follow-up regarding some persistent urinary symptoms.  Was seen here just last week with some burning with urination.  He took some leftover amoxicillin and Keflex.  Does have history of herpes but did not see any recent outbreaks.  He had taken some recent acyclovir but did not see any improvement.  He is monogamous.  We obtain urinalysis which was unremarkable.  Urine culture came back negative.  He took some Azo and has no burning at this time.  Does have occasional nocturia 2-3 times.  Has had some occasional slow stream recently.  Not taking any anticholinergic medications.  No hematuria.  No flank pain.  No history of kidney stones. ? ?Past Medical History:  ?Diagnosis Date  ? Chicken pox   ? Depression   ? Diabetes mellitus without complication ([place])   ? Hypertension   ? Urinary tract infection   ? ?Past Surgical History:  ?Procedure Laterality Date  ? COLON SURGERY  02/13/2018  ? Dr. Celene Skeen in [place]  ? KNEE SURGERY  10/2017  ? Right knee, tendon rupture  ? ? reports that he has never smoked. He has never used smokeless tobacco. He reports that he does not drink alcohol and does not use drugs. ?family history includes Cancer in his maternal grandfather; Diabetes in his father; Heart disease (age of onset: 30) in his father. ?Allergies  ?Allergen Reactions  ? Trulicity [Dulaglutide] Nausea Only  ? ? ?Review of Systems  ?Constitutional:  Negative for chills and fever.  ?Gastrointestinal:  Negative for abdominal pain, nausea and vomiting.  ?Genitourinary:  Positive for dysuria and frequency. Negative for flank pain and hematuria.  ? ?  ?Objective:  ?  ? ?BP 140/60 (BP Location: Left Arm, Patient Position: Sitting, Cuff Size: Normal)   Pulse 93   Temp 97.7 ?F (36.5 ?C) (Oral)   Ht 5'  6" (1.676 m)   Wt (!) 309 lb 4.8 oz (140.3 kg)   SpO2 96%   BMI 49.92 kg/m?  ? ? ?Physical Exam ?Vitals reviewed.  ?Constitutional:   ?   Appearance: Normal appearance.  ?Cardiovascular:  ?   Rate and Rhythm: Normal rate and regular rhythm.  ?Genitourinary: ?   Comments: Prostate is slightly enlarged.  Minimally tender.  Nonboggy.  No distinct nodules. ?Neurological:  ?   Mental Status: He is alert.  ? ? ? ?No results found for any visits on 08/16/21. ? ? ? ?The ASCVD Risk score (Arnett DK, et al., 2019) failed to calculate for the following reasons: ?  The valid total cholesterol range is 130 to 320 mg/dL ? ?  ?Assessment & Plan:  ? ?Patient has some persistent dysuria.  Recent urine dipstick and culture unremarkable.  No evidence for cystitis.  Prostate exam today mostly unremarkable except for some very mild tenderness and mild enlargement. ?-We did discuss that his current symptoms may be related to some mild BPH.  He is monogamous and has no risk for STIs so urine cytologies were not sent. ?-We discussed starting Flomax 0.4 mg nightly ?-Also discussed going ahead and start Cipro 500 mg twice daily for 10 days to cover for possible low-grade prostatitis. ?-Follow-up immediately for any fever or worsening symptoms. ?-He had very low PSA of  0.38 last fall with physical.  Repeat PSA with yearly physical ? ? ?No follow-ups on file.  ? ? ?Carolann Littler, MD ? ?

## 2021-08-18 ENCOUNTER — Other Ambulatory Visit: Payer: Self-pay | Admitting: Family Medicine

## 2021-08-19 ENCOUNTER — Other Ambulatory Visit: Payer: Self-pay

## 2021-08-19 MED ORDER — LISINOPRIL 20 MG PO TABS: 20.0000 mg | ORAL_TABLET | Freq: Every day | ORAL | 0 refills | Status: DC

## 2021-08-20 ENCOUNTER — Ambulatory Visit: Payer: Medicare Other | Admitting: Family Medicine

## 2021-08-26 ENCOUNTER — Telehealth: Payer: Self-pay

## 2021-08-26 NOTE — Telephone Encounter (Signed)
--  Caller states husband pulled a tick off of the back side of left knee in the bend. The tick was small, removed intact, and not engorged. Now the site is red and has a red streak going down from the site. It is also itchy. On Cipro at present, not related to tick bite.  08/25/2021 4:29:46 [place], RN, Sherrie   HOME CARE: * You should be able to treat this at home. REASSURANCE AND EDUCATION - BITE FROM UNKNOWN TYPE OF TICK: * Most tick bites are harmless and can be treated at home. * The spread of disease by ticks is not common. CALL BACK IF: ANTIBIOTIC OINTMENT: * [place] the bite area and your hands with soap and water after removing the tick. * Fever or rash occur in the next 4 weeks * Bite begins to look infected * You become worse  Comments User: Evlyn Clines, RN Date/Time Eilene Ghazi Time): 08/25/2021 4:19:33 PM Thinks got tick on Friday. Tick pulled out yesterday.

## 2021-09-06 ENCOUNTER — Other Ambulatory Visit: Payer: Self-pay | Admitting: Family Medicine

## 2021-09-07 ENCOUNTER — Other Ambulatory Visit: Payer: Self-pay | Admitting: Family Medicine

## 2021-09-20 ENCOUNTER — Encounter: Payer: Self-pay | Admitting: Family Medicine

## 2021-09-21 ENCOUNTER — Ambulatory Visit: Payer: Medicare Other

## 2021-09-27 ENCOUNTER — Ambulatory Visit: Payer: Medicare Other

## 2021-10-01 ENCOUNTER — Telehealth: Payer: Self-pay | Admitting: Family Medicine

## 2021-10-01 NOTE — Telephone Encounter (Signed)
Spoke to patient to schedule AWV'schedule Medicare Annual Wellness Visit (AWV) either virtually or in office. Left  my Tyler Andrews number [phone]  He stated he would look at his schedule and call back    awvi 04/04/21 per [place]  please schedule at anytime with [facility] Nurse Health Advisor 1 or 2   This should be a 45 minute visit.

## 2021-10-04 ENCOUNTER — Other Ambulatory Visit: Payer: Self-pay | Admitting: Family Medicine

## 2021-10-04 ENCOUNTER — Other Ambulatory Visit: Payer: Self-pay

## 2021-10-04 DIAGNOSIS — E1165 Type 2 diabetes mellitus with hyperglycemia: Secondary | ICD-10-CM

## 2021-10-04 MED ORDER — TIRZEPATIDE 7.5 MG/0.5ML ~~LOC~~ SOAJ: 7.5000 mg | SUBCUTANEOUS | 1 refills | Status: DC

## 2021-10-20 ENCOUNTER — Other Ambulatory Visit: Payer: Self-pay | Admitting: Family Medicine

## 2021-11-23 ENCOUNTER — Ambulatory Visit (INDEPENDENT_AMBULATORY_CARE_PROVIDER_SITE_OTHER): Payer: Medicare Other

## 2021-11-23 VITALS — Ht 66.0 in | Wt 300.0 lb

## 2021-11-23 DIAGNOSIS — Z0000 Encounter for general adult medical examination without abnormal findings: Secondary | ICD-10-CM | POA: Diagnosis not present

## 2021-11-23 NOTE — Progress Notes (Signed)
I connected with Tyler Andrews today by telephone and verified that I am speaking with the correct person using two identifiers. Location patient: home Location provider: work Persons participating in the virtual visit: Tyler Andrews, Glenna Durand LPN.   I discussed the limitations, risks, security and privacy concerns of performing an evaluation and management service by telephone and the availability of in person appointments. I also discussed with the patient that there may be a patient responsible charge related to this service. The patient expressed understanding and verbally consented to this telephonic visit.    Interactive audio and video telecommunications were attempted between this provider and patient, however failed, due to patient having technical difficulties OR patient did not have access to video capability.  We continued and completed visit with audio only.     Vital signs may be patient reported or missing.  Subjective:   Tyler Andrews is a 66 y.o. male who presents for an Initial Medicare Annual Wellness Visit.  Review of Systems     Cardiac Risk Factors include: advanced age (>49mn, >>64women);diabetes mellitus;dyslipidemia;hypertension;male gender;obesity (BMI >30kg/m2)     Objective:    Today's Vitals   11/23/21 0926  Weight: 300 lb (136.1 kg)  Height: '5\' 6"'$  (1.676 m)   Body mass index is 48.42 kg/m.     11/23/2021    9:34 AM  Advanced Directives  Does Patient Have a Medical Advance Directive? Yes  Type of [place] will  Copy of [place]? No - copy requested    Current Medications (verified) Outpatient Encounter Medications as of 11/23/2021  Medication Sig   acyclovir (ZOVIRAX) 800 MG tablet Take 1 tablet by mouth twice daily   aspirin 81 MG tablet Take 81 mg by mouth daily.   empagliflozin (JARDIANCE) 25 MG TABS tablet Take 1 tablet (25 mg total) by mouth daily  before breakfast.   famotidine (PEPCID) 40 MG tablet Take 40 mg by mouth at bedtime.   fluticasone (FLONASE) 50 MCG/ACT nasal spray Place 2 sprays into both nostrils daily.   insulin lispro (HUMALOG) 100 UNIT/ML KwikPen INJECT 20 UNITS SUBCUTANEOUSLY THREE TIMES DAILY WITH MEALS   lisinopril (ZESTRIL) 20 MG tablet Take 1 tablet by mouth once daily   LORazepam (ATIVAN) 0.5 MG tablet Take 1 tablet (0.5 mg total) by mouth every 8 (eight) hours as needed for anxiety.   metFORMIN (GLUCOPHAGE) 500 MG tablet Take 2 tablets (1,000 mg total) by mouth 2 (two) times daily with a meal. Needs appointment for refills.   methocarbamol (ROBAXIN) 500 MG tablet Take 1 tablet (500 mg total) by mouth every 8 (eight) hours as needed for muscle spasms.   Misc. Devices MISC Initiate Auto C-pap 5-15 cm with mask and supplies   PARoxetine (PAXIL) 20 MG tablet Take 1 tablet by mouth once daily   simvastatin (ZOCOR) 40 MG tablet Take 1 tablet (40 mg total) by mouth at bedtime.   tirzepatide (MOUNJARO) 7.5 MG/0.5ML Pen Inject 7.5 mg into the skin once a week.   TOUJEO SOLOSTAR 300 UNIT/ML Solostar Pen INJECT 90 UNITS SUBCUTANEOUSLY ONCE DAILY   cephALEXin (KEFLEX) 500 MG capsule Take 1 capsule (500 mg total) by mouth 4 (four) times daily. (Patient not taking: Reported on 11/23/2021)   ciprofloxacin (CIPRO) 500 MG tablet Take 1 tablet (500 mg total) by mouth 2 (two) times daily. (Patient not taking: Reported on 11/23/2021)   tamsulosin (FLOMAX) 0.4 MG CAPS capsule Take 1 capsule (0.4 mg  total) by mouth daily. (Patient not taking: Reported on 11/23/2021)   No facility-administered encounter medications on file as of 11/23/2021.    Allergies (verified) Trulicity [dulaglutide]   History: Past Medical History:  Diagnosis Date   Chicken pox    Depression    Diabetes mellitus without complication ([place])    Hypertension    Urinary tract infection    Past Surgical History:  Procedure Laterality Date   COLON SURGERY   02/13/2018   Dr. Celene Skeen in [place]  10/2017   Right knee, tendon rupture   Family History  Problem Relation Age of Onset   Diabetes Father    Heart disease Father 50       CAD   Cancer Maternal Grandfather        prostate   Social History   Socioeconomic History   Marital status: Married    Spouse name: Not on file   Number of children: Not on file   Years of education: Not on file   Highest education level: Associate degree: occupational, Hotel manager, or vocational program  Occupational History   Not on file  Tobacco Use   Smoking status: Never   Smokeless tobacco: Never  Vaping Use   Vaping Use: Never used  Substance and Sexual Activity   Alcohol use: No   Drug use: No   Sexual activity: Not on file  Other Topics Concern   Not on file  Social History Narrative   Not on file   Social Determinants of Health   Financial Resource Strain: Low Risk  (11/23/2021)   Overall Financial Resource Strain (CARDIA)    Difficulty of Paying Living Expenses: Not hard at all  Food Insecurity: No Food Insecurity (11/23/2021)   Hunger Vital Sign    Worried About Running Out of Food in the Last Year: Never true    [place] in the Last Year: Never true  Transportation Needs: No Transportation Needs (11/23/2021)   PRAPARE - Hydrologist (Medical): No    Lack of Transportation (Non-Medical): No  Physical Activity: Sufficiently Active (11/23/2021)   Exercise Vital Sign    Days of Exercise per Week: 3 days    Minutes of Exercise per Session: 50 min  Stress: No Stress Concern Present (11/23/2021)   [place]    Feeling of Stress : Only a little  Social Connections: Moderately Integrated (05/20/2021)   Social Connection and Isolation Panel [NHANES]    Frequency of Communication with Friends and Family: More than three times a week    Frequency of Social Gatherings with  Friends and Family: Three times a week    Attends Religious Services: 1 to 4 times per year    Active Member of Clubs or Organizations: No    Attends Music therapist: Not on file    Marital Status: Married    Tobacco Counseling Counseling given: Not Answered   Clinical Intake:  Pre-visit preparation completed: Yes  Pain : No/denies pain     Nutritional Status: BMI > 30  Obese Nutritional Risks: Nausea/ vomitting/ diarrhea (been feeling nauseous for two weeks) Diabetes: Yes  How often do you need to have someone help you when you read instructions, pamphlets, or other written materials from your doctor or pharmacy?: 1 - Never What is the last grade level you completed in school?: community college  Diabetic? Yes Nutrition Risk Assessment:  Has  the patient had any N/V/D within the last 2 months?  Yes  Does the patient have any non-healing wounds?  No  Has the patient had any unintentional weight loss or weight gain?  No   Diabetes:  Is the patient diabetic?  Yes  If diabetic, was a CBG obtained today?  No  Did the patient bring in their glucometer from home?  No  How often do you monitor your CBG's? daily.   Financial Strains and Diabetes Management:  Are you having any financial strains with the device, your supplies or your medication? No .  Does the patient want to be seen by Chronic Care Management for management of their diabetes?  No  Would the patient like to be referred to a Nutritionist or for Diabetic Management?  No   Diabetic Exams:  Diabetic Eye Exam: Completed 11/25/2020 Diabetic Foot Exam: Overdue, Pt has been advised about the importance in completing this exam. Pt is scheduled for diabetic foot exam on next appointment.   Interpreter Needed?: No  Information entered by :: NAllen LPN   Activities of Daily Living    11/23/2021    9:35 AM  In your present state of health, do you have any difficulty performing the following  activities:  Hearing? 0  Vision? 0  Difficulty concentrating or making decisions? 0  Walking or climbing stairs? 0  Dressing or bathing? 0  Doing errands, shopping? 0  Preparing Food and eating ? N  Using the Toilet? N  In the past six months, have you accidently leaked urine? N  Do you have problems with loss of bowel control? N  Managing your Medications? N  Managing your Finances? N  Housekeeping or managing your Housekeeping? N    Patient Care Team: Eulas Post, MD as PCP - General (Family Medicine)  Indicate any recent Medical Services you may have received from other than [facility] providers in the past year (date may be approximate).     Assessment:   This is a routine wellness examination for [place].  Hearing/Vision screen Vision Screening - Comments:: Regular eye exams, [facility]  Dietary issues and exercise activities discussed: Current Exercise Habits: Structured exercise class, Type of exercise: calisthenics, Time (Minutes): 45, Frequency (Times/Week): 3, Weekly Exercise (Minutes/Week): 135   Goals Addressed             This Visit's Progress    Patient Stated       11/23/2021, wants to lose weight       Depression Screen    11/23/2021    9:35 AM 02/17/2021    7:58 AM 05/11/2015    3:13 PM  PHQ 2/9 Scores  PHQ - 2 Score 0 2 0  PHQ- 9 Score  7     Fall Risk    11/23/2021    9:35 AM 08/13/2021   10:52 AM 05/20/2021    2:03 PM 02/17/2021    7:57 AM 09/04/2019    7:31 AM  Fall Risk   Falls in the past year? 0 0 1 1 0  Number falls in past yr: 0 0 0  0  Injury with Fall? 0 0 1  0  Risk for fall due to : Medication side effect No Fall Risks   No Fall Risks  Follow up Falls evaluation completed;Education provided;Falls prevention discussed Falls evaluation completed   Falls evaluation completed    FALL RISK PREVENTION PERTAINING TO THE HOME:  Any stairs in or around the home? Yes  If so, are there any without handrails? No  Home free of  loose throw rugs in walkways, pet beds, electrical cords, etc? Yes  Adequate lighting in your home to reduce risk of falls? Yes   ASSISTIVE DEVICES UTILIZED TO PREVENT FALLS:  Life alert? No  Use of a cane, walker or w/c? No  Grab bars in the bathroom? Yes  Shower chair or bench in shower? No  Elevated toilet seat or a handicapped toilet? Yes   TIMED UP AND GO:  Was the test performed? No .      Cognitive Function:        11/23/2021    9:38 AM  6CIT Screen  What Year? 0 points  What month? 0 points  What time? 0 points  Count back from 20 0 points  Months in reverse 0 points  Repeat phrase 0 points  Total Score 0 points    Immunizations Immunization History  Administered Date(s) Administered   Fluad Quad(high Dose 65+) 01/21/2020   Influenza Whole 01/16/2013   Influenza,inj,Quad PF,6+ Mos 01/12/2018, 12/31/2018   Influenza-Unspecified 01/11/2017, 01/25/2021   PFIZER(Purple Top)SARS-COV-2 Vaccination 03/13/2020, 02/15/2021, 02/15/2021   Pneumococcal Conjugate-13 11/28/2013, 02/17/2021   Pneumococcal Polysaccharide-23 09/04/2019   Td 04/05/2007   Tdap 02/22/2017   Zoster Recombinat (Shingrix) 04/02/2020, 06/18/2020    TDAP status: Up to date  Flu Vaccine status: Due, Education has been provided regarding the importance of this vaccine. Advised may receive this vaccine at local pharmacy or Health Dept. Aware to provide a copy of the vaccination record if obtained from local pharmacy or Health Dept. Verbalized acceptance and understanding.  Pneumococcal vaccine status: Up to date  Covid-19 vaccine status: Completed vaccines  Qualifies for Shingles Vaccine? Yes   Zostavax completed No   Shingrix Completed?: Yes  Screening Tests Health Maintenance  Topic Date Due   FOOT EXAM  12/31/2019   COVID-19 Vaccine (3 - Pfizer series) 04/12/2021   INFLUENZA VACCINE  11/02/2021   OPHTHALMOLOGY EXAM  11/25/2021   HEMOGLOBIN A1C  02/13/2022   TETANUS/TDAP  02/23/2027    COLONOSCOPY (Pts 45-10yr Insurance coverage will need to be confirmed)  02/14/2028   Pneumonia Vaccine 66 Years old  Completed   Hepatitis C Screening  Completed   Zoster Vaccines- Shingrix  Completed   HPV VACCINES  Aged Out    Health Maintenance  Health Maintenance Due  Topic Date Due   FOOT EXAM  12/31/2019   COVID-19 Vaccine (3 - Pfizer series) 04/12/2021   INFLUENZA VACCINE  11/02/2021    Colorectal cancer screening: Type of screening: Colonoscopy. Completed 02/13/2018. Repeat every 10 years  Lung Cancer Screening: (Low Dose CT Chest recommended if Age 67-80years, 30 pack-year currently smoking OR have quit w/in 15years.) does not qualify.   Lung Cancer Screening Referral: no  Additional Screening:  Hepatitis C Screening: does qualify; Completed 09/04/2019  Vision Screening: Recommended annual ophthalmology exams for early detection of glaucoma and other disorders of the eye. Is the patient up to date with their annual eye exam?  Yes  Who is the provider or what is the name of the office in which the patient attends annual eye exams? [facility] If pt is not established with a provider, would they like to be referred to a provider to establish care? No .   Dental Screening: Recommended annual dental exams for proper oral hygiene  Community Resource Referral / Chronic Care Management: CRR required this visit?  No   CCM required this visit?  No      Plan:     I have personally reviewed and noted the following in the patient's chart:   Medical and social history Use of alcohol, tobacco or illicit drugs  Current medications and supplements including opioid prescriptions. Patient is not currently taking opioid prescriptions. Functional ability and status Nutritional status Physical activity Advanced directives List of other physicians Hospitalizations, surgeries, and ER visits in previous 12 months Vitals Screenings to include cognitive, depression, and  falls Referrals and appointments  In addition, I have reviewed and discussed with patient certain preventive protocols, quality metrics, and best practice recommendations. A written personalized care plan for preventive services as well as general preventive health recommendations were provided to patient.     Kellie Simmering, LPN   8/88/2800   Nurse Notes: complaints of abdominal discomfort that has been going on for two weeks. Appointment made to see Dr. Elease Hashimoto to discuss.  Due to this being a virtual visit, the after visit summary with patients personalized plan was offered to patient via mail or my-chart. Patient would like to access on my-chart

## 2021-11-23 NOTE — Patient Instructions (Signed)
Mr. Cupples , Thank you for taking time to come for your Medicare Wellness Visit. I appreciate your ongoing commitment to your health goals. Please review the following plan we discussed and let me know if I can assist you in the future.   Screening recommendations/referrals: Colonoscopy: completed 02/13/2018, due 02/14/2028 Recommended yearly ophthalmology/optometry visit for glaucoma screening and checkup Recommended yearly dental visit for hygiene and checkup  Vaccinations: Influenza vaccine: due Pneumococcal vaccine: completed 02/17/2021 Tdap vaccine: completed 02/22/2017, due 02/23/2027 Shingles vaccine: completed   Covid-19:  02/15/2021, 03/13/2020  Advanced directives: Please bring a copy of your POA (Power of Attorney) and/or Living Will to your next appointment.   Conditions/risks identified: none  Next appointment: Follow up in one year for your annual wellness visit.   Preventive Care 67 Years and Older, Male Preventive care refers to lifestyle choices and visits with your health care provider that can promote health and wellness. What does preventive care include? A yearly physical exam. This is also called an annual well check. Dental exams once or twice a year. Routine eye exams. Ask your health care provider how often you should have your eyes checked. Personal lifestyle choices, including: Daily care of your teeth and gums. Regular physical activity. Eating a healthy diet. Avoiding tobacco and drug use. Limiting alcohol use. Practicing safe sex. Taking low doses of aspirin every day. Taking vitamin and mineral supplements as recommended by your health care provider. What happens during an annual well check? The services and screenings done by your health care provider during your annual well check will depend on your age, overall health, lifestyle risk factors, and family history of disease. Counseling  Your health care provider may ask you questions about  your: Alcohol use. Tobacco use. Drug use. Emotional well-being. Home and relationship well-being. Sexual activity. Eating habits. History of falls. Memory and ability to understand (cognition). Work and work Statistician. Screening  You may have the following tests or measurements: Height, weight, and BMI. Blood pressure. Lipid and cholesterol levels. These may be checked every 5 years, or more frequently if you are over 27 years old. Skin check. Lung cancer screening. You may have this screening every year starting at age 67 if you have a 30-pack-year history of smoking and currently smoke or have quit within the past 15 years. Fecal occult blood test (FOBT) of the stool. You may have this test every year starting at age 67. Flexible sigmoidoscopy or colonoscopy. You may have a sigmoidoscopy every 5 years or a colonoscopy every 10 years starting at age 67. Prostate cancer screening. Recommendations will vary depending on your family history and other risks. Hepatitis C blood test. Hepatitis B blood test. Sexually transmitted disease (STD) testing. Diabetes screening. This is done by checking your blood sugar (glucose) after you have not eaten for a while (fasting). You may have this done every 1-3 years. Abdominal aortic aneurysm (AAA) screening. You may need this if you are a current or former smoker. Osteoporosis. You may be screened starting at age 67 if you are at high risk. Talk with your health care provider about your test results, treatment options, and if necessary, the need for more tests. Vaccines  Your health care provider may recommend certain vaccines, such as: Influenza vaccine. This is recommended every year. Tetanus, diphtheria, and acellular pertussis (Tdap, Td) vaccine. You may need a Td booster every 10 years. Zoster vaccine. You may need this after age 67. Pneumococcal 13-valent conjugate (PCV13) vaccine. One dose is recommended  after age 21. Pneumococcal  polysaccharide (PPSV23) vaccine. One dose is recommended after age 42. Talk to your health care provider about which screenings and vaccines you need and how often you need them. This information is not intended to replace advice given to you by your health care provider. Make sure you discuss any questions you have with your health care provider. Document Released: 04/17/2015 Document Revised: 12/09/2015 Document Reviewed: 01/20/2015 Elsevier Interactive Patient Education  2017 [place] Prevention in the Home Falls can cause injuries. They can happen to people of all ages. There are many things you can do to make your home safe and to help prevent falls. What can I do on the outside of my home? Regularly fix the edges of walkways and driveways and fix any cracks. Remove anything that might make you trip as you walk through a door, such as a raised step or threshold. Trim any bushes or trees on the path to your home. Use bright outdoor lighting. Clear any walking paths of anything that might make someone trip, such as rocks or tools. Regularly check to see if handrails are loose or broken. Make sure that both sides of any steps have handrails. Any raised decks and porches should have guardrails on the edges. Have any leaves, snow, or ice cleared regularly. Use sand or salt on walking paths during winter. Clean up any spills in your garage right away. This includes oil or grease spills. What can I do in the bathroom? Use night lights. Install grab bars by the toilet and in the tub and shower. Do not use towel bars as grab bars. Use non-skid mats or decals in the tub or shower. If you need to sit down in the shower, use a plastic, non-slip stool. Keep the floor dry. Clean up any water that spills on the floor as soon as it happens. Remove soap buildup in the tub or shower regularly. Attach bath mats securely with double-sided non-slip rug tape. Do not have throw rugs and other  things on the floor that can make you trip. What can I do in the bedroom? Use night lights. Make sure that you have a light by your bed that is easy to reach. Do not use any sheets or blankets that are too big for your bed. They should not hang down onto the floor. Have a firm chair that has side arms. You can use this for support while you get dressed. Do not have throw rugs and other things on the floor that can make you trip. What can I do in the kitchen? Clean up any spills right away. Avoid walking on wet floors. Keep items that you use a lot in easy-to-reach places. If you need to reach something above you, use a strong step stool that has a grab bar. Keep electrical cords out of the way. Do not use floor polish or wax that makes floors slippery. If you must use wax, use non-skid floor wax. Do not have throw rugs and other things on the floor that can make you trip. What can I do with my stairs? Do not leave any items on the stairs. Make sure that there are handrails on both sides of the stairs and use them. Fix handrails that are broken or loose. Make sure that handrails are as long as the stairways. Check any carpeting to make sure that it is firmly attached to the stairs. Fix any carpet that is loose or worn. Avoid having throw  rugs at the top or bottom of the stairs. If you do have throw rugs, attach them to the floor with carpet tape. Make sure that you have a light switch at the top of the stairs and the bottom of the stairs. If you do not have them, ask someone to add them for you. What else can I do to help prevent falls? Wear shoes that: Do not have high heels. Have rubber bottoms. Are comfortable and fit you well. Are closed at the toe. Do not wear sandals. If you use a stepladder: Make sure that it is fully opened. Do not climb a closed stepladder. Make sure that both sides of the stepladder are locked into place. Ask someone to hold it for you, if possible. Clearly  mark and make sure that you can see: Any grab bars or handrails. First and last steps. Where the edge of each step is. Use tools that help you move around (mobility aids) if they are needed. These include: Canes. Walkers. Scooters. Crutches. Turn on the lights when you go into a dark area. Replace any light bulbs as soon as they burn out. Set up your furniture so you have a clear path. Avoid moving your furniture around. If any of your floors are uneven, fix them. If there are any pets around you, be aware of where they are. Review your medicines with your doctor. Some medicines can make you feel dizzy. This can increase your chance of falling. Ask your doctor what other things that you can do to help prevent falls. This information is not intended to replace advice given to you by your health care provider. Make sure you discuss any questions you have with your health care provider. Document Released: 01/15/2009 Document Revised: 08/27/2015 Document Reviewed: 04/25/2014 Elsevier Interactive Patient Education  2017 [employer].

## 2021-11-26 ENCOUNTER — Ambulatory Visit (INDEPENDENT_AMBULATORY_CARE_PROVIDER_SITE_OTHER): Payer: Medicare Other | Admitting: Family Medicine

## 2021-11-26 ENCOUNTER — Encounter: Payer: Self-pay | Admitting: Family Medicine

## 2021-11-26 VITALS — BP 120/60 | HR 75 | Temp 98.4°F | Ht 66.0 in | Wt 311.7 lb

## 2021-11-26 DIAGNOSIS — E1165 Type 2 diabetes mellitus with hyperglycemia: Secondary | ICD-10-CM | POA: Diagnosis not present

## 2021-11-26 DIAGNOSIS — R197 Diarrhea, unspecified: Secondary | ICD-10-CM | POA: Diagnosis not present

## 2021-11-26 DIAGNOSIS — R1033 Periumbilical pain: Secondary | ICD-10-CM

## 2021-11-26 DIAGNOSIS — K429 Umbilical hernia without obstruction or gangrene: Secondary | ICD-10-CM

## 2021-11-26 LAB — POCT GLYCOSYLATED HEMOGLOBIN (HGB A1C): Hemoglobin A1C: 6.2 % — AB (ref 4.0–5.6)

## 2021-11-26 MED ORDER — METFORMIN HCL ER 500 MG PO TB24: ORAL_TABLET | ORAL | 3 refills | Status: DC

## 2021-11-26 NOTE — Patient Instructions (Signed)
Give me some feedback regarding the CT scan (ie preference of location)  Let me know if diarrhea not resolving with the change in Metformin.

## 2021-11-26 NOTE — Progress Notes (Signed)
Established Patient Office Visit  Subjective   Patient ID: Tyler Andrews, male    DOB: 09/06/1955  Age: 66 y.o. MRN: [id number]  Chief Complaint  Patient presents with   Abdominal Pain    Patient complains of central and left-sided abdominal pain, flatulence and diarrhea x2 weeks    HPI   Check has history of morbid obesity, hyperlipidemia, chronic insomnia, type 2 diabetes, hypertension.  Seen today with some abdominal complaints mostly central just above the umbilicus and slightly left-sided.  He also relates about 2-week history of increased flatulence and diarrhea.  He had some intermittent loose stools for some time.  Is been on metformin for years but denies any chronic diarrhea related to metformin.  No recent dietary change.  No fever.  No nausea or vomiting.  He is taking Mounjaro which we started several months ago.  Blood sugars have improved but surprisingly has not lost any weight with this.  He does have known history of umbilical hernia.  He noticed some swelling just superior to this and assume this was another hernia.  Denies any recent bloody stools or melena.  No chest pains.  Past Medical History:  Diagnosis Date   Chicken pox    Depression    Diabetes mellitus without complication ([place])    Hypertension    Urinary tract infection    Past Surgical History:  Procedure Laterality Date   COLON SURGERY  02/13/2018   Dr. Celene Skeen in [place]  10/2017   Right knee, tendon rupture    reports that he has never smoked. He has never used smokeless tobacco. He reports that he does not drink alcohol and does not use drugs. family history includes Cancer in his maternal grandfather; Diabetes in his father; Heart disease (age of onset: 35) in his father. Allergies  Allergen Reactions   Trulicity [Dulaglutide] Nausea Only    Review of Systems  Constitutional:  Negative for chills, fever and weight loss.  Respiratory:  Negative for shortness of  breath.   Cardiovascular:  Negative for chest pain.  Gastrointestinal:  Positive for abdominal pain and diarrhea. Negative for blood in stool, constipation, melena, nausea and vomiting.  Genitourinary:  Negative for dysuria.      Objective:     BP 120/60 (BP Location: Left Arm, Patient Position: Sitting, Cuff Size: Large)   Pulse 75   Temp 98.4 F (36.9 C) (Oral)   Ht '5\' 6"'$  (1.676 m)   Wt (!) 311 lb 11.2 oz (141.4 kg)   SpO2 97%   BMI 50.31 kg/m  BP Readings from Last 3 Encounters:  11/26/21 120/60  08/16/21 140/60  08/13/21 136/60   Wt Readings from Last 3 Encounters:  11/26/21 (!) 311 lb 11.2 oz (141.4 kg)  11/23/21 300 lb (136.1 kg)  08/16/21 (!) 309 lb 4.8 oz (140.3 kg)      Physical Exam Vitals reviewed.  Abdominal:     Comments: Normal bowel sounds.  He has obvious small umbilical hernia which is soft and nontender.  Just superior to this he has approximately 4 x 6 cm fairly well demarcated soft tissue mass which is mildly tender to palpation.  No guarding or rebound.  Neurological:     Mental Status: He is alert.      Results for orders placed or performed in visit on 11/26/21  POC HgB A1c  Result Value Ref Range   Hemoglobin A1C 6.2 (A) 4.0 - 5.6 %   HbA1c  POC (<> result, manual entry)     HbA1c, POC (prediabetic range)     HbA1c, POC (controlled diabetic range)        The ASCVD Risk score (Arnett DK, et al., 2019) failed to calculate for the following reasons:   The valid total cholesterol range is 130 to 320 mg/dL    Assessment & Plan:   #1 type 2 diabetes improved with A1c 6.2% with recent addition of Mounjaro.  We did discuss possible pushing dose for weight loss but would like to wait at this time with his symptoms of diarrhea which he thinks could be related  #2 frequent loose stools.  Etiology unclear.  Has been on metformin for some time and has tolerated well in the past.  We did discuss switching his immediate release metformin to extended  release and will start metformin XR 500 mg twice daily  #3 abdominal pain.  Patient has known umbilical hernia.  He has soft tissue mass just superior and to the left of this which may be a lipoma.  We discussed either referral to general surgeon versus further imaging with CT scan.  He would like to check on availability of imaging place closer to his home in [place].   Reviewed signs and symptoms of strangulation.  Follow-up immediately for any fever, worsening pain, or other concerns   No follow-ups on file.    Carolann Littler, MD

## 2021-12-04 ENCOUNTER — Other Ambulatory Visit: Payer: Self-pay | Admitting: Family Medicine

## 2021-12-07 ENCOUNTER — Telehealth: Payer: Self-pay | Admitting: *Deleted

## 2021-12-07 NOTE — Patient Outreach (Signed)
  Care Coordination   12/07/2021 Name: Tyler Andrews MRN: [id number] DOB: 1956-01-03   Care Coordination Outreach Attempts:  An unsuccessful telephone outreach was attempted today to offer the patient information about available care coordination services as a benefit of their health plan.   Follow Up Plan:  Additional outreach attempts will be made to offer the patient care coordination information and services.   Encounter Outcome:  No Answer  Care Coordination Interventions Activated:  No   Care Coordination Interventions:  No, not indicated    Raina Mina, RN Care Management Coordinator [place] Office [phone]

## 2021-12-21 ENCOUNTER — Other Ambulatory Visit: Payer: Self-pay | Admitting: Family Medicine

## 2021-12-21 DIAGNOSIS — E1165 Type 2 diabetes mellitus with hyperglycemia: Secondary | ICD-10-CM

## 2021-12-27 ENCOUNTER — Other Ambulatory Visit: Payer: Self-pay | Admitting: Family Medicine

## 2021-12-28 ENCOUNTER — Encounter: Payer: Self-pay | Admitting: Family Medicine

## 2021-12-28 ENCOUNTER — Ambulatory Visit (INDEPENDENT_AMBULATORY_CARE_PROVIDER_SITE_OTHER): Payer: Medicare Other | Admitting: Family Medicine

## 2021-12-28 VITALS — BP 138/66 | HR 82 | Temp 98.5°F | Ht 66.0 in | Wt 310.0 lb

## 2021-12-28 DIAGNOSIS — R140 Abdominal distension (gaseous): Secondary | ICD-10-CM

## 2021-12-28 DIAGNOSIS — E1165 Type 2 diabetes mellitus with hyperglycemia: Secondary | ICD-10-CM

## 2021-12-28 DIAGNOSIS — R1084 Generalized abdominal pain: Secondary | ICD-10-CM

## 2021-12-28 DIAGNOSIS — R197 Diarrhea, unspecified: Secondary | ICD-10-CM

## 2021-12-28 NOTE — Patient Instructions (Signed)
Consider abdominal ultrasound to further assess GI symptoms  Let me know regarding location to set up.   Reduce Metformin to once daily.   Set up physical for November or December.

## 2021-12-28 NOTE — Progress Notes (Signed)
Established Patient Office Visit  Subjective   Patient ID: Tyler Andrews, male    DOB: 1956/01/30  Age: 66 y.o. MRN: [id number]  Chief Complaint  Patient presents with   GI Problem    X1 month     HPI   Tyler Andrews is seen for follow-up regarding recent abdominal complaints.  Referred to prior note for details.  He complains of some abdominal bloating somewhat diffusely and somewhat smaller more frequent stools.  Occasionally loose.  Never watery.  No bloody stools.  He took some Gas-X and probiotic without any improvement.  He states his last colonoscopy was around 2019.  Denies any nausea or vomiting.  We recently switched from immediate release to extended release metformin without any change in symptoms.  He thought that his symptoms might be related to [facility] and he held that for several weeks without any change.  No other recent change of medication.  We had discussed possible imaging last visit but he never got back with [place] regarding preferred imaging location.  He currently lives in [place] and was trying to find something closer to home.  His diabetes has improved greatly with Mounjaro with recent A1c 6.2%.  Unfortunately, his weight has not gone down much.  He is exercising few days per week.  No recent chest pain.  Past Medical History:  Diagnosis Date   Chicken pox    Depression    Diabetes mellitus without complication ([place])    Hypertension    Urinary tract infection    Past Surgical History:  Procedure Laterality Date   COLON SURGERY  02/13/2018   Dr. Celene Skeen in [place]  10/2017   Right knee, tendon rupture    reports that he has never smoked. He has never used smokeless tobacco. He reports that he does not drink alcohol and does not use drugs. family history includes Cancer in his maternal grandfather; Diabetes in his father; Heart disease (age of onset: 66) in his father. Allergies  Allergen Reactions   Trulicity [Dulaglutide] Nausea Only     Review of Systems  Constitutional:  Negative for chills, fever and weight loss.  Respiratory:  Negative for cough, shortness of breath and wheezing.   Cardiovascular:  Negative for chest pain.  Gastrointestinal:  Negative for heartburn, nausea and vomiting.       See HPI.  Frequent abdominal bloating.  Genitourinary:  Negative for dysuria.      Objective:     BP 138/66 (BP Location: Left Arm, Patient Position: Sitting, Cuff Size: Normal)   Pulse 82   Temp 98.5 F (36.9 C) (Oral)   Ht '5\' 6"'$  (1.676 m)   Wt (!) 310 lb (140.6 kg)   SpO2 98%   BMI 50.04 kg/m  BP Readings from Last 3 Encounters:  12/28/21 138/66  11/26/21 120/60  08/16/21 140/60   Wt Readings from Last 3 Encounters:  12/28/21 (!) 310 lb (140.6 kg)  11/26/21 (!) 311 lb 11.2 oz (141.4 kg)  11/23/21 300 lb (136.1 kg)      Physical Exam Vitals reviewed.  Constitutional:      General: He is not in acute distress.    Appearance: He is obese. He is not toxic-appearing.  Cardiovascular:     Rate and Rhythm: Normal rate and regular rhythm.  Pulmonary:     Effort: Pulmonary effort is normal.     Breath sounds: Normal breath sounds.  Abdominal:     Palpations: Abdomen is soft.  Tenderness: There is no abdominal tenderness. There is no guarding or rebound.  Musculoskeletal:     Right lower leg: No edema.     Left lower leg: No edema.      No results found for any visits on 12/28/21.  Last CBC Lab Results  Component Value Date   WBC 9.1 02/17/2021   HGB 14.2 02/17/2021   HCT 44.0 02/17/2021   MCV 89.4 02/17/2021   RDW 15.2 02/17/2021   PLT 296.0 99/83/3825   Last metabolic panel Lab Results  Component Value Date   GLUCOSE 122 (H) 02/17/2021   NA 140 02/17/2021   K 4.6 02/17/2021   CL 103 02/17/2021   CO2 29 02/17/2021   BUN 19 02/17/2021   CREATININE 0.84 02/17/2021   CALCIUM 9.2 02/17/2021   PROT 6.9 02/17/2021   ALBUMIN 4.1 02/17/2021   BILITOT 0.4 02/17/2021   ALKPHOS 59  02/17/2021   AST 11 02/17/2021   ALT 13 02/17/2021      The ASCVD Risk score (Arnett DK, et al., 2019) failed to calculate for the following reasons:   The valid total cholesterol range is 130 to 320 mg/dL    Assessment & Plan:   #1 abdominal bloating and frequent loose stools.  Etiology unclear.  He does not have any red flags such as appetite change, weight loss, bloody stools, etc.  Has tried several things as above without improvement. -Reduce metformin extended release to once daily since his diabetes has been fairly well controlled -Handout on low FODMAP diet given to see if this reduces his gas symptoms -He has concerns regarding whether we should get some imaging.  We explained that ultrasound might be a reasonable place to start although he does not have any localizing findings on exam and no clinical suspicion for gallbladder disease. -We did discuss referral back to GI if symptoms not improved with reducing metformin and low-FODMAP diet  #2 type 2 diabetes.  Improved control with A1c 6.2%.  Reduce metformin to once daily. -Recheck A1c at follow-up in a few months  -Set up complete physical in the next couple months and complete labs then  No follow-ups on file.    Carolann Littler, MD

## 2021-12-30 ENCOUNTER — Encounter: Payer: Self-pay | Admitting: *Deleted

## 2021-12-30 ENCOUNTER — Telehealth: Payer: Self-pay

## 2021-12-30 ENCOUNTER — Telehealth: Payer: Self-pay | Admitting: *Deleted

## 2021-12-30 DIAGNOSIS — I10 Essential (primary) hypertension: Secondary | ICD-10-CM

## 2021-12-30 NOTE — Patient Instructions (Signed)
Visit Information  Thank you for taking time to visit with me today. Please don't hesitate to contact me if I can be of assistance to you.   Following are the goals we discussed today:   Goals Addressed               This Visit's Progress     COMPLETED: weight loss information (pt-stated)        Care Coordination Interventions: Advised patient to discuss with primary care provider options regarding weight management Provided patient and/or caregiver with contact information about Healthy Weight and Wellness ([place]) (community resource or dietician) Provided verbal and/or written education to patient re: provider recommended life style modifications  Screening for signs and symptoms of depression Assessed social determinant of health barriers Offered program specific for weight loss however pt opt to decline awaiting consult with GI for ongoing issues PCP is aware and has documented on related issues.  Pt receptive to educational information to be added to MyChart and care-guides for weight loss clinic through [facility] for further assistance. Also verified pt has completed his AWV for this year.           Please call the care guide team at [phone] if you need to cancel or reschedule your appointment.   If you are experiencing a Mental Health or [place] or need someone to talk to, please call the Suicide and Crisis Lifeline: 988  Patient verbalizes understanding of instructions and care plan provided today and agrees to view in [place]. Active MyChart status and patient understanding of how to access instructions and care plan via MyChart confirmed with patient.     No further follow up required: No needs  Raina Mina, RN Care Management Coordinator [place] Office [phone]

## 2021-12-30 NOTE — Patient Outreach (Signed)
  Care Coordination   Initial Visit Note   12/30/2021 Name: Tyler Andrews MRN: [id number] DOB: Nov 15, 1955  Tyler Andrews is a 66 y.o. year old male who sees Burchette, Alinda Sierras, MD for primary care. I spoke with  [name] by phone today.  What matters to the patients health and wellness today?  Weight loss information    Goals Addressed               This Visit's Progress     COMPLETED: weight loss information (pt-stated)        Care Coordination Interventions: Advised patient to discuss with primary care provider options regarding weight management Provided patient and/or caregiver with contact information about Healthy Weight and Wellness ([place]) (community resource or dietician) Provided verbal and/or written education to patient re: provider recommended life style modifications  Screening for signs and symptoms of depression Assessed social determinant of health barriers Offered program specific for weight loss however pt opt to decline awaiting consult with GI for ongoing issues PCP is aware and has documented on related issues.  Pt receptive to educational information to be added to MyChart and care-guides for weight loss clinic through [facility] for further assistance. Also verified pt has completed his AWV for this year.          SDOH assessments and interventions completed:  Yes  SDOH Interventions Today    Flowsheet Row Most Recent Value  SDOH Interventions   Food Insecurity Interventions Intervention Not Indicated  Housing Interventions Intervention Not Indicated  Transportation Interventions Intervention Not Indicated  Utilities Interventions Intervention Not Indicated        Care Coordination Interventions Activated:  Yes  Care Coordination Interventions:  Yes, provided   Follow up plan: No further intervention required.   Encounter Outcome:  Pt. Visit Completed   Raina Mina, RN Care Management Coordinator [place] Office [phone]

## 2021-12-30 NOTE — Telephone Encounter (Signed)
   Telephone encounter was:  Unsuccessful.  12/30/2021 Name: Tyler Andrews MRN: [id number] DOB: [birth year]  Unsuccessful outbound call made today to assist with:   weight loss resources.  Outreach Attempt:  1st Attempt  A HIPAA compliant voice message was left requesting a return call.  Instructed patient to call back at [phone].  [place] [facility] Care Guide   [name]  ?? [phone]   Website: [email or web address]  [email or web address]  "We don't say no, we SHOW how!"         [facility]

## 2021-12-30 NOTE — Telephone Encounter (Signed)
Ultrasound has been ordered.   Ordered as complete abdomen so includes area of his umbilical hernia.   Eulas Post MD [place] [place] [facility]

## 2022-01-03 ENCOUNTER — Ambulatory Visit (INDEPENDENT_AMBULATORY_CARE_PROVIDER_SITE_OTHER): Payer: Medicare Other

## 2022-01-03 DIAGNOSIS — R1084 Generalized abdominal pain: Secondary | ICD-10-CM

## 2022-01-03 DIAGNOSIS — K7689 Other specified diseases of liver: Secondary | ICD-10-CM | POA: Diagnosis not present

## 2022-01-03 DIAGNOSIS — R197 Diarrhea, unspecified: Secondary | ICD-10-CM | POA: Diagnosis not present

## 2022-01-04 ENCOUNTER — Telehealth: Payer: Self-pay

## 2022-01-04 NOTE — Telephone Encounter (Signed)
   Telephone encounter was:  Successful.  01/04/2022 Name: Tyler Andrews MRN: [id number] DOB: [birth year]  Tyler Andrews is a 66 y.o. year old male who is a primary care patient of Burchette, Alinda Sierras, MD . The community resource team was consulted for assistance with  weight loss resources.  Care guide performed the following interventions: Spoke with patient's spouse, she stated that she will have him return my call, also left a message on mobile to return my call regarding weight loss resources.  Follow Up Plan:  Care guide will follow up with patient by phone over the next 7 days.  [place] Resource Care Guide   ??[email or web address]  ?? [phone]   Website: [email or web address]  [email or web address]

## 2022-01-06 ENCOUNTER — Telehealth: Payer: Self-pay

## 2022-01-06 NOTE — Telephone Encounter (Signed)
   Telephone encounter was:  Unsuccessful.  01/06/2022 Name: Tyler Andrews MRN: [id number] DOB: 1955/09/03  Unsuccessful outbound call made today to assist with:   weight loss resources.  Outreach Attempt:  3rd Attempt.  Referral closed unable to contact patient.  A HIPAA compliant voice message was left requesting a return call.  Instructed patient to call back at [phone].  [facility] Resource Care Guide   ??[email or web address]  ?? [phone]   Website: [email or web address]  [email or web address]

## 2022-01-30 ENCOUNTER — Other Ambulatory Visit: Payer: Self-pay | Admitting: Family Medicine

## 2022-02-07 ENCOUNTER — Other Ambulatory Visit: Payer: Self-pay | Admitting: Family Medicine

## 2022-02-08 DIAGNOSIS — K219 Gastro-esophageal reflux disease without esophagitis: Secondary | ICD-10-CM | POA: Diagnosis not present

## 2022-02-08 DIAGNOSIS — J0301 Acute recurrent streptococcal tonsillitis: Secondary | ICD-10-CM | POA: Diagnosis not present

## 2022-02-10 ENCOUNTER — Other Ambulatory Visit: Payer: Self-pay | Admitting: Family Medicine

## 2022-02-21 ENCOUNTER — Ambulatory Visit (INDEPENDENT_AMBULATORY_CARE_PROVIDER_SITE_OTHER): Payer: Medicare Other | Admitting: Family Medicine

## 2022-02-21 ENCOUNTER — Encounter: Payer: Self-pay | Admitting: Family Medicine

## 2022-02-21 ENCOUNTER — Other Ambulatory Visit: Payer: Self-pay | Admitting: Family Medicine

## 2022-02-21 VITALS — BP 130/60 | HR 76 | Temp 97.4°F | Ht 65.35 in | Wt 308.1 lb

## 2022-02-21 DIAGNOSIS — Z0000 Encounter for general adult medical examination without abnormal findings: Secondary | ICD-10-CM

## 2022-02-21 DIAGNOSIS — E1165 Type 2 diabetes mellitus with hyperglycemia: Secondary | ICD-10-CM | POA: Diagnosis not present

## 2022-02-21 LAB — LIPID PANEL
Cholesterol: 128 mg/dL (ref 0–200)
HDL: 44.9 mg/dL (ref >39.00)
LDL Cholesterol: 56 mg/dL (ref 0–99)
NonHDL: 83.34
Total CHOL/HDL Ratio: 3
Triglycerides: 139 mg/dL (ref 0.0–149.0)
VLDL: 27.8 mg/dL (ref 0.0–40.0)

## 2022-02-21 LAB — HEPATIC FUNCTION PANEL
ALT: 15 U/L (ref 0–53)
AST: 13 U/L (ref 0–37)
Albumin: 4 g/dL (ref 3.5–5.2)
Alkaline Phosphatase: 62 U/L (ref 39–117)
Bilirubin, Direct: 0.1 mg/dL (ref 0.0–0.3)
Total Bilirubin: 0.4 mg/dL (ref 0.2–1.2)
Total Protein: 6.9 g/dL (ref 6.0–8.3)

## 2022-02-21 LAB — CBC WITH DIFFERENTIAL/PLATELET
Basophils Absolute: 0.1 10*3/uL (ref 0.0–0.1)
Basophils Relative: 0.6 % (ref 0.0–3.0)
Eosinophils Absolute: 0.3 10*3/uL (ref 0.0–0.7)
Eosinophils Relative: 3.2 % (ref 0.0–5.0)
HCT: 44.2 % (ref 39.0–52.0)
Hemoglobin: 14.6 g/dL (ref 13.0–17.0)
Lymphocytes Relative: 28.5 % (ref 12.0–46.0)
Lymphs Abs: 2.6 10*3/uL (ref 0.7–4.0)
MCHC: 32.9 g/dL (ref 30.0–36.0)
MCV: 87.6 fl (ref 78.0–100.0)
Monocytes Absolute: 0.9 10*3/uL (ref 0.1–1.0)
Monocytes Relative: 9.8 % (ref 3.0–12.0)
Neutro Abs: 5.3 10*3/uL (ref 1.4–7.7)
Neutrophils Relative %: 57.9 % (ref 43.0–77.0)
Platelets: 308 10*3/uL (ref 150.0–400.0)
RBC: 5.05 Mil/uL (ref 4.22–5.81)
RDW: 15.5 % (ref 11.5–15.5)
WBC: 9.1 10*3/uL (ref 4.0–10.5)

## 2022-02-21 LAB — HEMOGLOBIN A1C: Hgb A1c MFr Bld: 7 % — ABNORMAL HIGH (ref 4.6–6.5)

## 2022-02-21 LAB — BASIC METABOLIC PANEL
BUN: 16 mg/dL (ref 6–23)
CO2: 26 mEq/L (ref 19–32)
Calcium: 8.9 mg/dL (ref 8.4–10.5)
Chloride: 103 mEq/L (ref 96–112)
Creatinine, Ser: 0.83 mg/dL (ref 0.40–1.50)
GFR: 90.53 mL/min (ref >60.00)
Glucose, Bld: 145 mg/dL — ABNORMAL HIGH (ref 70–99)
Potassium: 4.7 mEq/L (ref 3.5–5.1)
Sodium: 138 mEq/L (ref 135–145)

## 2022-02-21 LAB — MICROALBUMIN / CREATININE URINE RATIO
Creatinine,U: 44.8 mg/dL
Microalb Creat Ratio: 9.1 mg/g (ref 0.0–30.0)
Microalb, Ur: 4.1 mg/dL — ABNORMAL HIGH (ref 0.0–1.9)

## 2022-02-21 LAB — TSH: TSH: 1.94 u[IU]/mL (ref 0.35–5.50)

## 2022-02-21 LAB — PSA: PSA: 0.4 ng/mL (ref 0.10–4.00)

## 2022-02-21 MED ORDER — TIRZEPATIDE 10 MG/0.5ML ~~LOC~~ SOAJ: 10.0000 mg | SUBCUTANEOUS | 3 refills | Status: DC

## 2022-02-21 NOTE — Progress Notes (Signed)
Established Patient Office Visit  Subjective   Patient ID: Tyler Andrews, male    DOB: [birth year]  Age: 66 y.o. MRN: [id number]  Chief Complaint  Patient presents with   Annual Exam    HPI   Tyler Andrews is seen for physical exam.  His chronic problems include history of morbid obesity, hypertension, type 2 diabetes, obstructive sleep apnea, dyslipidemia, history of depression, chronic insomnia.  His diabetes has been challenging to control.  He is currently on Mounjaro and initially lost quite a bit of weight but seems to have plateaued.  He also is on Humalog, Toujeo, metformin, and Jardiance.  No recent hypoglycemic symptoms.  Last A1c 6.2%.  Generally doing well.  No specific complaints other than does still have some abdominal bloating and increased gas symptoms.  Recent ultrasound unremarkable.  Health maintenance reviewed:  -Declines flu vaccine -Pneumonia vaccines complete -Shingles vaccine complete -Prior hepatitis C screen negative -Repeat colonoscopy due 2029 -Just had eye exam in August.  Family history and social history reviewed with no significant changes.  Past Medical History:  Diagnosis Date   Chicken pox    Depression    Diabetes mellitus without complication ([facility])    Hypertension    Urinary tract infection    Past Surgical History:  Procedure Laterality Date   COLON SURGERY  02/13/2018   Dr. Celene Skeen in [place]  10/2017   Right knee, tendon rupture    reports that he has never smoked. He has never used smokeless tobacco. He reports that he does not drink alcohol and does not use drugs. family history includes Cancer in his maternal grandfather; Diabetes in his father; Heart disease (age of onset: 43) in his father. Allergies  Allergen Reactions   Trulicity [Dulaglutide] Nausea Only     Review of Systems  Constitutional:  Negative for chills, fever, malaise/fatigue and weight loss.  HENT:  Negative for hearing loss.    Eyes:  Negative for blurred vision and double vision.  Respiratory:  Negative for cough and shortness of breath.   Cardiovascular:  Negative for chest pain, palpitations and leg swelling.  Gastrointestinal:  Negative for blood in stool, constipation and diarrhea.  Genitourinary:  Negative for dysuria.  Skin:  Negative for rash.  Neurological:  Negative for dizziness, speech change, seizures, loss of consciousness and headaches.  Psychiatric/Behavioral:  Negative for depression.       Objective:     BP 130/60 (BP Location: Left Arm, Patient Position: Sitting, Cuff Size: Large)   Pulse 76   Temp (!) 97.4 F (36.3 C) (Oral)   Ht 5' 5.35" (1.66 m)   Wt (!) 308 lb 1.6 oz (139.8 kg)   SpO2 97%   BMI 50.72 kg/m    Physical Exam Vitals reviewed.  Constitutional:      Appearance: He is obese.  HENT:     Head: Normocephalic and atraumatic.  Cardiovascular:     Rate and Rhythm: Normal rate and regular rhythm.  Pulmonary:     Effort: Pulmonary effort is normal.     Breath sounds: Normal breath sounds.  Abdominal:     Comments: Fairly large umbilical hernia.  Musculoskeletal:     Cervical back: Neck supple.     Right lower leg: No edema.     Left lower leg: No edema.  Skin:    Comments: Feet reveal no skin lesions. Good distal foot pulses. Good capillary refill. No calluses. Normal sensation with monofilament testing  Neurological:     General: No focal deficit present.      No results found for any visits on 02/21/22.    The ASCVD Risk score (Arnett DK, et al., 2019) failed to calculate for the following reasons:   The valid total cholesterol range is 130 to 320 mg/dL    Assessment & Plan:   Physical exam.  Patient has chronic problems as above.  We discussed the following health maintenance issues  -Flu vaccine offered but he declines -We discussed weight loss in some detail.  We will titrate Mounjaro to 10 mg weekly if tolerated -Check urine microalbumin with  labs -Other health maintenance up-to-date -Continue yearly diabetic eye exam -We did discuss the fact that we need to gradually reduce his insulin hopefully as we increase Mounjaro. -Also discussed continuous glucose monitoring and I recommended he consider if covered by insurance.  He will check on coverage.   No follow-ups on file.    Carolann Littler, MD

## 2022-02-21 NOTE — Patient Instructions (Addendum)
Go ahead and increase the Mounjaro to 10 mg once weekly  Would probably go ahead and decrease the Toujeo to 80 units and we need to be prepared to reduce further.

## 2022-03-01 ENCOUNTER — Other Ambulatory Visit: Payer: Self-pay | Admitting: Family Medicine

## 2022-03-11 ENCOUNTER — Other Ambulatory Visit: Payer: Self-pay | Admitting: Family Medicine

## 2022-03-12 ENCOUNTER — Other Ambulatory Visit: Payer: Self-pay | Admitting: Family Medicine

## 2022-04-01 ENCOUNTER — Encounter: Payer: Self-pay | Admitting: Family Medicine

## 2022-04-10 ENCOUNTER — Other Ambulatory Visit: Payer: Self-pay | Admitting: Family Medicine

## 2022-04-11 ENCOUNTER — Other Ambulatory Visit: Payer: Self-pay | Admitting: Family Medicine

## 2022-05-29 ENCOUNTER — Other Ambulatory Visit: Payer: Self-pay | Admitting: Family Medicine

## 2022-06-06 ENCOUNTER — Other Ambulatory Visit: Payer: Self-pay | Admitting: Family Medicine

## 2022-06-20 ENCOUNTER — Other Ambulatory Visit: Payer: Self-pay | Admitting: Family Medicine

## 2022-06-24 ENCOUNTER — Encounter: Payer: Self-pay | Admitting: Family Medicine

## 2022-06-24 ENCOUNTER — Telehealth: Payer: Self-pay | Admitting: Family Medicine

## 2022-06-24 ENCOUNTER — Ambulatory Visit (INDEPENDENT_AMBULATORY_CARE_PROVIDER_SITE_OTHER): Payer: Medicare Other | Admitting: Family Medicine

## 2022-06-24 VITALS — BP 158/62 | HR 101 | Ht 65.0 in | Wt 304.2 lb

## 2022-06-24 DIAGNOSIS — I10 Essential (primary) hypertension: Secondary | ICD-10-CM

## 2022-06-24 DIAGNOSIS — E785 Hyperlipidemia, unspecified: Secondary | ICD-10-CM | POA: Diagnosis not present

## 2022-06-24 DIAGNOSIS — E1165 Type 2 diabetes mellitus with hyperglycemia: Secondary | ICD-10-CM | POA: Diagnosis not present

## 2022-06-24 LAB — POCT GLYCOSYLATED HEMOGLOBIN (HGB A1C): Hemoglobin A1C: 6.2 % — AB (ref 4.0–5.6)

## 2022-06-24 MED ORDER — TIRZEPATIDE 12.5 MG/0.5ML ~~LOC~~ SOAJ: 12.5000 mg | SUBCUTANEOUS | 3 refills | Status: DC

## 2022-06-24 MED ORDER — AMLODIPINE BESYLATE 5 MG PO TABS: 5.0000 mg | ORAL_TABLET | Freq: Every day | ORAL | 5 refills | Status: DC

## 2022-06-24 NOTE — Telephone Encounter (Signed)
Patient just saw his provider and forgot to say that he wants a machine like a Dexcom to wear to check his glusose levels.  Please contact pt with information.

## 2022-06-24 NOTE — Patient Instructions (Signed)
Start the Amlodipine 5 mg one daily  We are sending in the Moujaro 12.5 mg [place] once daily.  Let's plan on 3 month follow up.

## 2022-06-24 NOTE — Progress Notes (Signed)
Established Patient Office Visit  Subjective   Patient ID: Tyler Andrews, male    DOB: 1955/04/29  Age: 67 y.o. MRN: [id number]  Chief Complaint  Patient presents with   Medical Management of Chronic Issues    diabetes    HPI   Tyler Andrews is here for medical follow-up.  He has history of morbid obesity, hypertension, obstructive sleep apnea, type 2 diabetes, history of recurrent depression, hyperlipidemia  Currently on Mounjaro 10 units daily.  He has had some further gradual weight loss.  Last weight 308 and currently 304.  Exercising at the gym 2 days/week.  His blood sugars have improved with fastings consistently low 100 range.  Most recent A1c was 7.0%.  Currently still on very high dose of insulin with Toujeo 90 units daily and Humalog 20 units 3 times daily.  No recent hypoglycemic symptoms.  His blood pressures have been inching up somewhat.  He is currently on lisinopril 20 mg daily.  No headaches or dizziness.  Hyperlipidemia treated with simvastatin 40 mg daily.  Recent lipids were stable and at goal.  Past Medical History:  Diagnosis Date   Chicken pox    Depression    Diabetes mellitus without complication ([facility])    Hypertension    Urinary tract infection    Past Surgical History:  Procedure Laterality Date   COLON SURGERY  02/13/2018   Dr. Celene Skeen in [place]  10/2017   Right knee, tendon rupture    reports that he has never smoked. He has never used smokeless tobacco. He reports that he does not drink alcohol and does not use drugs. family history includes Cancer in his maternal grandfather; Diabetes in his father; Heart disease (age of onset: 68) in his father. Allergies  Allergen Reactions   Trulicity [Dulaglutide] Nausea Only    Review of Systems  Constitutional:  Negative for malaise/fatigue.  Eyes:  Negative for blurred vision.  Respiratory:  Negative for shortness of breath.   Cardiovascular:  Negative for chest pain.   Neurological:  Negative for dizziness, weakness and headaches.      Objective:     BP (!) 158/62   Pulse (!) 101   Ht 5\' 5"  (1.651 m)   Wt (!) 304 lb 3.2 oz (138 kg)   SpO2 92%   BMI 50.62 kg/m  BP Readings from Last 3 Encounters:  06/24/22 (!) 158/62  02/21/22 130/60  12/28/21 138/66   Wt Readings from Last 3 Encounters:  06/24/22 (!) 304 lb 3.2 oz (138 kg)  02/21/22 (!) 308 lb 1.6 oz (139.8 kg)  12/28/21 (!) 310 lb (140.6 kg)      Physical Exam Vitals reviewed.  Constitutional:      Appearance: He is well-developed.  HENT:     Right Ear: External ear normal.     Left Ear: External ear normal.  Eyes:     Pupils: Pupils are equal, round, and reactive to light.  Neck:     Thyroid: No thyromegaly.  Cardiovascular:     Rate and Rhythm: Normal rate and regular rhythm.  Pulmonary:     Effort: Pulmonary effort is normal. No respiratory distress.     Breath sounds: Normal breath sounds. No wheezing or rales.  Musculoskeletal:     Cervical back: Neck supple.     Right lower leg: No edema.     Left lower leg: No edema.  Neurological:     Mental Status: He is alert and  oriented to person, place, and time.      Results for orders placed or performed in visit on 06/24/22  POCT HgB A1C  Result Value Ref Range   Hemoglobin A1C 6.2 (A) 4.0 - 5.6 %   HbA1c POC (<> result, manual entry)     HbA1c, POC (prediabetic range)     HbA1c, POC (controlled diabetic range)      Last CBC Lab Results  Component Value Date   WBC 9.1 02/21/2022   HGB 14.6 02/21/2022   HCT 44.2 02/21/2022   MCV 87.6 02/21/2022   RDW 15.5 02/21/2022   PLT 308.0 [id number]   Last metabolic panel Lab Results  Component Value Date   GLUCOSE 145 (H) 02/21/2022   NA 138 02/21/2022   K 4.7 02/21/2022   CL 103 02/21/2022   CO2 26 02/21/2022   BUN 16 02/21/2022   CREATININE 0.83 02/21/2022   CALCIUM 8.9 02/21/2022   PROT 6.9 02/21/2022   ALBUMIN 4.0 02/21/2022   BILITOT 0.4 02/21/2022    ALKPHOS 62 02/21/2022   AST 13 02/21/2022   ALT 15 02/21/2022   Last lipids Lab Results  Component Value Date   CHOL 128 02/21/2022   HDL 44.90 02/21/2022   LDLCALC 56 02/21/2022   LDLDIRECT 74.4 05/24/2013   TRIG 139.0 02/21/2022   CHOLHDL 3 02/21/2022   Last hemoglobin A1c Lab Results  Component Value Date   HGBA1C 6.2 (A) 06/24/2022      The ASCVD Risk score (Arnett DK, et al., 2019) failed to calculate for the following reasons:   The valid total cholesterol range is 130 to 320 mg/dL    Assessment & Plan:   #1 hypertension suboptimally controlled.  Continue lisinopril 20 mg daily.  Add amlodipine 5 mg daily.  Set up follow-up and a few months to reassess.  He will monitor closely at home in the meantime.  Our goal will be less than 130/80.  Hopefully will continue to improve with weight loss as well  #2 type 2 diabetes improving control with A1c 6.2%.  We have recommend he try to slowly down titrate the Toujeo and Humalog.  Will try reducing Toujeo to 80 units daily and Humalog 15 units 3 times daily with meals.  As he continues to lose weight we can hopefully reduce this even further.  Reassess in 3 months  #3 hyperlipidemia treated with simvastatin and well-controlled by recent lipids.   Return in about 3 months (around 09/24/2022).    Carolann Littler, MD

## 2022-06-28 MED ORDER — DEXCOM G6 SENSOR MISC: 1 refills | Status: DC

## 2022-06-28 MED ORDER — DEXCOM G6 RECEIVER DEVI: 1 refills | Status: AC

## 2022-06-28 NOTE — Telephone Encounter (Signed)
Rx sent 

## 2022-07-06 ENCOUNTER — Other Ambulatory Visit: Payer: Self-pay | Admitting: Family Medicine

## 2022-07-07 ENCOUNTER — Encounter: Payer: Self-pay | Admitting: Family Medicine

## 2022-07-07 ENCOUNTER — Telehealth: Payer: Self-pay | Admitting: Family Medicine

## 2022-07-07 MED ORDER — INSULIN LISPRO (1 UNIT DIAL) 100 UNIT/ML (KWIKPEN): PEN_INJECTOR | SUBCUTANEOUS | 2 refills | Status: DC

## 2022-07-07 MED ORDER — TIRZEPATIDE 7.5 MG/0.5ML ~~LOC~~ SOAJ: 7.5000 mg | SUBCUTANEOUS | 0 refills | Status: DC

## 2022-07-07 NOTE — Telephone Encounter (Signed)
Patient informed rx was sent  

## 2022-07-07 NOTE — Telephone Encounter (Signed)
Pharmacy does not have 12.5, requesting tirzepatide Darcel Bayley) 7.5 MG/0.5ML [place] [address], [place] [address]  [phone] [place], [place] [address]

## 2022-07-08 ENCOUNTER — Telehealth: Payer: Self-pay | Admitting: Family Medicine

## 2022-07-08 ENCOUNTER — Encounter: Payer: Self-pay | Admitting: Family Medicine

## 2022-07-08 MED ORDER — DEXCOM G6 TRANSMITTER MISC: 1 refills | Status: DC

## 2022-07-08 NOTE — Telephone Encounter (Signed)
Rx sent 

## 2022-07-08 NOTE — Telephone Encounter (Signed)
Says they also need the transmitter for the Continuous Blood Gluc Receiver (DEXCOM G6 RECEIVER) DEV

## 2022-08-05 ENCOUNTER — Encounter: Payer: Self-pay | Admitting: Family Medicine

## 2022-08-05 MED ORDER — KETOCONAZOLE 2 % EX CREA: 1.0000 | TOPICAL_CREAM | Freq: Two times a day (BID) | CUTANEOUS | 1 refills | Status: AC | PRN

## 2022-08-05 NOTE — Telephone Encounter (Signed)
I sent in Ketoconazole cream

## 2022-08-31 ENCOUNTER — Other Ambulatory Visit: Payer: Self-pay | Admitting: Family Medicine

## 2022-09-02 ENCOUNTER — Other Ambulatory Visit: Payer: Self-pay | Admitting: Family Medicine

## 2022-09-05 ENCOUNTER — Other Ambulatory Visit: Payer: Self-pay | Admitting: Family Medicine

## 2022-09-27 ENCOUNTER — Ambulatory Visit (INDEPENDENT_AMBULATORY_CARE_PROVIDER_SITE_OTHER): Payer: Medicare Other | Admitting: Family Medicine

## 2022-09-27 ENCOUNTER — Encounter: Payer: Self-pay | Admitting: Family Medicine

## 2022-09-27 VITALS — BP 134/62 | HR 82 | Temp 97.7°F | Ht 65.0 in | Wt 303.8 lb

## 2022-09-27 DIAGNOSIS — I10 Essential (primary) hypertension: Secondary | ICD-10-CM

## 2022-09-27 DIAGNOSIS — E1165 Type 2 diabetes mellitus with hyperglycemia: Secondary | ICD-10-CM | POA: Diagnosis not present

## 2022-09-27 DIAGNOSIS — Z7984 Long term (current) use of oral hypoglycemic drugs: Secondary | ICD-10-CM

## 2022-09-27 DIAGNOSIS — Z794 Long term (current) use of insulin: Secondary | ICD-10-CM | POA: Diagnosis not present

## 2022-09-27 DIAGNOSIS — Z7985 Long-term (current) use of injectable non-insulin antidiabetic drugs: Secondary | ICD-10-CM | POA: Diagnosis not present

## 2022-09-27 LAB — POCT GLYCOSYLATED HEMOGLOBIN (HGB A1C): Hemoglobin A1C: 6.2 % — AB (ref 4.0–5.6)

## 2022-09-27 NOTE — Patient Instructions (Signed)
A1C was 6.2% which is excellent  Try scaling the Humalog back to 15 units three times daily.

## 2022-09-27 NOTE — Progress Notes (Signed)
Established Patient Office Visit  Subjective   Patient ID: Tyler Andrews, male    DOB: Aug 05, 1955  Age: 67 y.o. MRN: [id number]  Chief Complaint  Patient presents with   Medical Management of Chronic Issues    HPI   Tyler Andrews is seen for chronic medical follow-up.  He has history of morbid obesity with BMI over 50, hypertension, obstructive sleep apnea, type 2 diabetes, history of recurrent depression, hyperlipidemia.  He is on several diabetes medications including Humalog 20 units 3 times daily, Toujeo 90 units daily, Mounjaro 12.5 mg subcutaneous once weekly, Jardiance 25 mg daily, and metformin extended release 500 mg once daily.  He has Dexcom meter but has had difficulties with this technically with some improper readings and is trying to get this sorted out.  No recent hypoglycemic symptoms.  A1c's have been significant improvement starting Mounjaro but he is unfortunate not had the weight loss he was hoping to have.  Last A1c was 6.2%.  Struggled to lose weight but does take Paxil and reluctant to come off and also high-dose insulin which certainly could be contributing.  He currently goes to [facility] 2 days/week but knows he needs to step up his exercise.  Denies any recent chest pains.  Remains on simvastatin 40 mg daily for hyperlipidemia and lipids been well-controlled.  He takes amlodipine and lisinopril for hypertension and blood pressures have been relatively stable.  Past Medical History:  Diagnosis Date   Chicken pox    Depression    Diabetes mellitus without complication ([facility])    Hypertension    Urinary tract infection    Past Surgical History:  Procedure Laterality Date   COLON SURGERY  02/13/2018   Dr. Gloriajean Andrews in [place]   KNEE SURGERY  10/2017   Right knee, tendon rupture    reports that he has never smoked. He has never used smokeless tobacco. He reports that he does not drink alcohol and does not use drugs. family history includes Cancer in his  maternal grandfather; Diabetes in his father; Heart disease (age of onset: 5) in his father. Allergies  Allergen Reactions   Trulicity [Dulaglutide] Nausea Only    Review of Systems  Constitutional:  Negative for malaise/fatigue.  Eyes:  Negative for blurred vision.  Respiratory:  Negative for shortness of breath.   Cardiovascular:  Negative for chest pain.  Neurological:  Negative for dizziness, weakness and headaches.      Objective:     BP 134/62 (BP Location: Left Arm, Patient Position: Sitting, Cuff Size: Large)   Pulse 82   Temp 97.7 F (36.5 C) (Oral)   Ht 5\' 5"  (1.651 m)   Wt (!) 303 lb 12.8 oz (137.8 kg)   SpO2 98%   BMI 50.55 kg/m  BP Readings from Last 3 Encounters:  09/27/22 134/62  06/24/22 (!) 158/62  02/21/22 130/60   Wt Readings from Last 3 Encounters:  09/27/22 (!) 303 lb 12.8 oz (137.8 kg)  06/24/22 (!) 304 lb 3.2 oz (138 kg)  02/21/22 (!) 308 lb 1.6 oz (139.8 kg)      Physical Exam Vitals reviewed.  Constitutional:      Appearance: He is well-developed.  HENT:     Right Ear: External ear normal.     Left Ear: External ear normal.  Eyes:     Pupils: Pupils are equal, round, and reactive to light.  Neck:     Thyroid: No thyromegaly.  Cardiovascular:     Rate and Rhythm:  Normal rate and regular rhythm.  Pulmonary:     Effort: Pulmonary effort is normal. No respiratory distress.     Breath sounds: Normal breath sounds. No wheezing or rales.  Musculoskeletal:     Cervical back: Neck supple.     Right lower leg: No edema.     Left lower leg: No edema.  Neurological:     Mental Status: He is alert and oriented to person, place, and time.      Results for orders placed or performed in visit on 09/27/22  POC HgB A1c  Result Value Ref Range   Hemoglobin A1C 6.2 (A) 4.0 - 5.6 %   HbA1c POC (<> result, manual entry)     HbA1c, POC (prediabetic range)     HbA1c, POC (controlled diabetic range)      Last CBC Lab Results  Component  Value Date   WBC 9.1 02/21/2022   HGB 14.6 02/21/2022   HCT 44.2 02/21/2022   MCV 87.6 02/21/2022   RDW 15.5 02/21/2022   PLT 308.0 02/21/2022   Last metabolic panel Lab Results  Component Value Date   GLUCOSE 145 (H) 02/21/2022   NA 138 02/21/2022   K 4.7 02/21/2022   CL 103 02/21/2022   CO2 26 02/21/2022   BUN 16 02/21/2022   CREATININE 0.83 02/21/2022   CALCIUM 8.9 02/21/2022   PROT 6.9 02/21/2022   ALBUMIN 4.0 02/21/2022   BILITOT 0.4 02/21/2022   ALKPHOS 62 02/21/2022   AST 13 02/21/2022   ALT 15 02/21/2022   Last lipids Lab Results  Component Value Date   CHOL 128 02/21/2022   HDL 44.90 02/21/2022   LDLCALC 56 02/21/2022   LDLDIRECT 74.4 05/24/2013   TRIG 139.0 02/21/2022   CHOLHDL 3 02/21/2022   Last hemoglobin A1c Lab Results  Component Value Date   HGBA1C 6.2 (A) 09/27/2022   Last thyroid functions Lab Results  Component Value Date   TSH 1.94 02/21/2022      The ASCVD Risk score (Arnett DK, et al., 2019) failed to calculate for the following reasons:   The valid total cholesterol range is 130 to 320 mg/dL    Assessment & Plan:   #1 type 2 diabetes controlled with A1c 6.2%.  Would like him to try to scale back his insulin gradually and he will try reducing Humalog from 20 units 3 times daily with meals to 15 units and monitor closely.  He is trying to get his Dexcom 6 continuous glucose monitor fixed as he had some technical problems.  Set up 43-month follow-up.  Would also like to eventually see him reduce dosage of Toujeo if possible  #2 hypertension stable.  Continue lisinopril and amlodipine.  Hopefully will have some ongoing continued weight loss which should help further as well  #3 morbid obesity.  We discussed the fact is on high-dose insulin and Paxil which are likely contributing to his difficulty losing.  Currently on Mounjaro 12.5 mg once weekly but does have some mild nausea with that.  We did discuss possibility of titrating up to 15 mg  but given his low-grade nausea will wait for now.  We strongly encouraged him to step up his regular exercise to minimum 150 minutes/week.   Return in about 3 months (around 12/28/2022).    Evelena Peat, MD

## 2022-10-02 ENCOUNTER — Encounter: Payer: Self-pay | Admitting: Family Medicine

## 2022-10-03 MED ORDER — PREDNISONE 10 MG PO TABS: ORAL_TABLET | ORAL | 0 refills | Status: DC

## 2022-10-23 ENCOUNTER — Other Ambulatory Visit: Payer: Self-pay | Admitting: Adult Health

## 2022-11-02 ENCOUNTER — Ambulatory Visit: Payer: Medicare Other

## 2022-11-02 VITALS — Ht 65.0 in | Wt 303.0 lb

## 2022-11-02 DIAGNOSIS — Z0000 Encounter for general adult medical examination without abnormal findings: Secondary | ICD-10-CM

## 2022-11-02 NOTE — Progress Notes (Signed)
Subjective:   Tyler Andrews is a 67 y.o. male who presents for Medicare Annual/Subsequent preventive examination.  Visit Complete: Virtual  I connected with  Tyler Andrews on 11/02/22 by a audio enabled telemedicine application and verified that I am speaking with the correct person using two identifiers.  Patient Location: Home  Provider Location: Home Office  I discussed the limitations of evaluation and management by telemedicine. The patient expressed understanding and agreed to proceed.  Patient Medicare AWV questionnaire was completed by the patient on 11/01/22; I have confirmed that all information answered by patient is correct and no changes since this date.  Review of Systems    Vital Signs: Unable to obtain new vitals due to this being a telehealth visit.  Cardiac Risk Factors include: advanced age (>62men, >16 women);male gender;diabetes mellitus     Objective:    Today's Vitals   11/02/22 1303  Weight: (!) 303 lb (137.4 kg)  Height: 5\' 5"  (1.651 m)   Body mass index is 50.42 kg/m.     11/02/2022    1:12 PM 11/23/2021    9:34 AM  Advanced Directives  Does Patient Have a Medical Advance Directive? Yes Yes  Type of Estate agent of [place];Living will Healthcare Power of [place];Living will  Copy of Healthcare Power of Attorney in Chart? No - copy requested No - copy requested    Current Medications (verified) Outpatient Encounter Medications as of 11/02/2022  Medication Sig   acyclovir (ZOVIRAX) 800 MG tablet Take 1 tablet by mouth twice daily   amLODipine (NORVASC) 5 MG tablet Take 1 tablet (5 mg total) by mouth daily.   aspirin 81 MG tablet Take 81 mg by mouth daily.   Continuous Blood Gluc Receiver (DEXCOM G6 RECEIVER) DEVI Use as directed to check blood sugar daily   Continuous Blood Gluc Transmit (DEXCOM G6 TRANSMITTER) MISC Use as directed to check blood sugar daily   Continuous Glucose Sensor (DEXCOM G6 SENSOR) MISC USE  AS DIRECTED TO  CHECK  BLOOD  SUGAR  DAILY   famotidine (PEPCID) 40 MG tablet Take 40 mg by mouth at bedtime.   fluticasone (FLONASE) 50 MCG/ACT nasal spray Place 2 sprays into both nostrils daily.   insulin lispro (HUMALOG) 100 UNIT/ML KwikPen INJECT 20 UNITS SUBCUTANEOUSLY THREE TIMES DAILY WITH MEALS   JARDIANCE 25 MG TABS tablet TAKE 1 TABLET BY MOUTH ONCE DAILY BEFORE BREAKFAST   ketoconazole (NIZORAL) 2 % cream Apply 1 Application topically 2 (two) times daily as needed for irritation.   lisinopril (ZESTRIL) 20 MG tablet Take 1 tablet by mouth once daily   LORazepam (ATIVAN) 0.5 MG tablet Take 1 tablet (0.5 mg total) by mouth every 8 (eight) hours as needed for anxiety.   metFORMIN (GLUCOPHAGE-XR) 500 MG 24 hr tablet Take one tablet by mouth twice daily (Patient taking differently: Take 500 mg by mouth daily with breakfast. Take one tablet by mouth once daily)   methocarbamol (ROBAXIN) 500 MG tablet Take 1 tablet (500 mg total) by mouth every 8 (eight) hours as needed for muscle spasms.   Misc. Devices MISC Initiate Auto C-pap 5-15 cm with mask and supplies   PARoxetine (PAXIL) 20 MG tablet Take 1 tablet by mouth once daily   predniSONE (DELTASONE) 10 MG tablet taper as follows: 4-4-4-4-3-3-3-2-2-1-1   simvastatin (ZOCOR) 40 MG tablet TAKE 1 TABLET BY MOUTH AT BEDTIME   tirzepatide (MOUNJARO) 12.5 MG/0.5ML Pen Inject 12.5 mg into the skin once a week.  TOUJEO SOLOSTAR 300 UNIT/ML Solostar Pen INJECT 90 UNITS SUBCUTANEOUSLY ONCE DAILY   No facility-administered encounter medications on file as of 11/02/2022.    Allergies (verified) Trulicity [dulaglutide]   History: Past Medical History:  Diagnosis Date   Chicken pox    Depression    Diabetes mellitus without complication (HCC)    Hypertension    Urinary tract infection    Past Surgical History:  Procedure Laterality Date   COLON SURGERY  02/13/2018   Dr. Gloriajean Dell in [place]   KNEE SURGERY  10/2017   Right knee, tendon  rupture   Family History  Problem Relation Age of Onset   Diabetes Father    Heart disease Father 49       CAD   Cancer Maternal Grandfather        prostate   Social History   Socioeconomic History   Marital status: Married    Spouse name: Not on file   Number of children: Not on file   Years of education: Not on file   Highest education level: Associate degree: occupational, Scientist, product/process development, or vocational program  Occupational History   Not on file  Tobacco Use   Smoking status: Never   Smokeless tobacco: Never  Vaping Use   Vaping status: Never Used  Substance and Sexual Activity   Alcohol use: No   Drug use: No   Sexual activity: Not on file  Other Topics Concern   Not on file  Social History Narrative   Not on file   Social Determinants of Health   Financial Resource Strain: Low Risk  (11/02/2022)   Overall Financial Resource Strain (CARDIA)    Difficulty of Paying Living Expenses: Not hard at all  Food Insecurity: No Food Insecurity (11/02/2022)   Hunger Vital Sign    Worried About Running Out of Food in the Last Year: Never true    Ran Out of Food in the Last Year: Never true  Transportation Needs: No Transportation Needs (11/02/2022)   PRAPARE - Administrator, Civil Service (Medical): No    Lack of Transportation (Non-Medical): No  Physical Activity: Insufficiently Active (11/02/2022)   Exercise Vital Sign    Days of Exercise per Week: 2 days    Minutes of Exercise per Session: 60 min  Stress: No Stress Concern Present (11/02/2022)   [employer] of Occupational Health - Occupational Stress Questionnaire    Feeling of Stress : Not at all  Social Connections: Socially Integrated (11/02/2022)   Social Connection and Isolation Panel [NHANES]    Frequency of Communication with Friends and Family: More than three times a week    Frequency of Social Gatherings with Friends and Family: More than three times a week    Attends Religious Services: 1 to 4  times per year    Active Member of [employer] or Organizations: Yes    Attends Engineer, structural: More than 4 times per year    Marital Status: Married    Tobacco Counseling Counseling given: Not Answered   Clinical Intake:  Pre-visit preparation completed: Yes  Pain : No/denies pain     BMI - recorded: 30.42 Nutritional Status: BMI > 30  Obese Nutritional Risks: None Diabetes: Yes CBG done?: No (Audio visit) Did pt. bring in CBG monitor from home?: No  How often do you need to have someone help you when you read instructions, pamphlets, or other written materials from your doctor or pharmacy?: 1 - Never  Interpreter Needed?:  No  Information entered by :: Theresa Mulligan LPN   Activities of Daily Living    11/02/2022    1:11 PM 11/01/2022    3:53 PM  In your present state of health, do you have any difficulty performing the following activities:  Hearing? 0 0  Vision? 0 0  Difficulty concentrating or making decisions? 0 0  Walking or climbing stairs? 0 0  Dressing or bathing? 0 0  Doing errands, shopping? 0 0  Preparing Food and eating ? N   Using the Toilet? N N  In the past six months, have you accidently leaked urine? N N  Do you have problems with loss of bowel control? N N  Managing your Medications? N N  Managing your Finances? N N  Housekeeping or managing your Housekeeping? N N    Patient Care Team: Kristian Covey, MD as PCP - General (Family Medicine) Alejandro Mulling, RN as [facility] Care Management  Indicate any recent Medical Services you may have received from other than [facility] providers in the past year (date may be approximate).     Assessment:   This is a routine wellness examination for [place].  Hearing/Vision screen Hearing Screening - Comments:: Denies hearing difficulties   Vision Screening - Comments:: Wears rx glasses - up to date with routine eye exams with  [facility]  Dietary issues and exercise  activities discussed:     Goals Addressed               This Visit's Progress     Patient Stated (pt-stated)        lose weight.       Depression Screen    11/02/2022    1:10 PM 02/21/2022    9:02 AM 12/30/2021   10:06 AM 11/23/2021    9:35 AM 02/17/2021    7:58 AM 05/11/2015    3:13 PM  PHQ 2/9 Scores  PHQ - 2 Score 0 2 0 0 2 0  PHQ- 9 Score  7   7     Fall Risk    11/02/2022    1:12 PM 11/01/2022    3:53 PM 06/24/2022    2:47 PM 06/20/2022    7:19 PM 02/21/2022    9:02 AM  Fall Risk   Falls in the past year? 0 0 0 0 0  Number falls in past yr: 0 0 0  0  Injury with Fall? 0 0 0  0  Risk for fall due to : No Fall Risks  No Fall Risks  No Fall Risks  Follow up Falls prevention discussed  Falls evaluation completed  Falls evaluation completed    MEDICARE RISK AT HOME:  Medicare Risk at Home - 11/02/22 1314     Any stairs in or around the home? Yes    If so, are there any without handrails? No    Home free of loose throw rugs in walkways, pet beds, electrical cords, etc? Yes    Adequate lighting in your home to reduce risk of falls? Yes    Life alert? No    Use of a cane, walker or w/c? No    Grab bars in the bathroom? Yes    Shower chair or bench in shower? No    Elevated toilet seat or a handicapped toilet? Yes             TIMED UP AND GO:  Was the test performed?  No  Cognitive Function:        11/23/2021    9:38 AM  6CIT Screen  What Year? 0 points  What month? 0 points  What time? 0 points  Count back from 20 0 points  Months in reverse 0 points  Repeat phrase 0 points  Total Score 0 points    Immunizations Immunization History  Administered Date(s) Administered   Fluad Quad(high Dose 65+) 01/21/2020, 04/01/2022   Influenza Whole 01/16/2013   Influenza, High Dose Seasonal PF 03/22/2022   Influenza,inj,Quad PF,6+ Mos 01/12/2018, 12/31/2018   Influenza-Unspecified 01/11/2017, 01/25/2021   PFIZER(Purple Top)SARS-COV-2 Vaccination  03/13/2020, 02/15/2021, 02/15/2021   Pneumococcal Conjugate-13 11/28/2013, 02/17/2021   Pneumococcal Polysaccharide-23 09/04/2019   Td 04/05/2007   Tdap 02/22/2017   Zoster Recombinant(Shingrix) 04/02/2020, 06/18/2020    TDAP status: Up to date  Flu Vaccine status: Up to date  Pneumococcal vaccine status: Up to date  Covid-19 vaccine status: Completed vaccines  Qualifies for Shingles Vaccine? Yes   Zostavax completed Yes   Shingrix Completed?: Yes  Screening Tests Health Maintenance  Topic Date Due   COVID-19 Vaccine (4 - 2023-24 season) 12/03/2021   INFLUENZA VACCINE  11/03/2022   OPHTHALMOLOGY EXAM  11/17/2022   Diabetic kidney evaluation - eGFR measurement  02/22/2023   Diabetic kidney evaluation - Urine ACR  02/22/2023   FOOT EXAM  02/22/2023   HEMOGLOBIN A1C  03/29/2023   Medicare Annual Wellness (AWV)  11/02/2023   DTaP/Tdap/Td (3 - Td or Tdap) 02/23/2027   Colonoscopy  02/14/2028   Pneumonia Vaccine 59+ Years old  Completed   Hepatitis C Screening  Completed   Zoster Vaccines- Shingrix  Completed   HPV VACCINES  Aged Out    Health Maintenance  Health Maintenance Due  Topic Date Due   COVID-19 Vaccine (4 - 2023-24 season) 12/03/2021    Colorectal cancer screening: Type of screening: Colonoscopy. Completed 02/14/18. Repeat every 10 years  Lung Cancer Screening: (Low Dose CT Chest recommended if Age 73-80 years, 20 pack-year currently smoking OR have quit w/in 15years.) does not qualify.     Additional Screening:  Hepatitis C Screening: does qualify; Completed 09/04/19  Vision Screening: Recommended annual ophthalmology exams for early detection of glaucoma and other disorders of the eye. Is the patient up to date with their annual eye exam?  Yes  Who is the provider or what is the name of the office in which the patient attends annual eye exams? [facility] If pt is not established with a provider, would they like to be referred to a provider to  establish care? No .   Dental Screening: Recommended annual dental exams for proper oral hygiene  Diabetic Foot Exam: Diabetic Foot Exam: Completed 02/21/22  Community Resource Referral / Chronic Care Management:  CRR required this visit?  No   CCM required this visit?  No     Plan:     I have personally reviewed and noted the following in the patient's chart:   Medical and social history Use of alcohol, tobacco or illicit drugs  Current medications and supplements including opioid prescriptions. Patient is not currently taking opioid prescriptions. Functional ability and status Nutritional status Physical activity Advanced directives List of other physicians Hospitalizations, surgeries, and ER visits in previous 12 months Vitals Screenings to include cognitive, depression, and falls Referrals and appointments  In addition, I have reviewed and discussed with patient certain preventive protocols, quality metrics, and best practice recommendations. A written personalized care plan for preventive services as  well as general preventive health recommendations were provided to patient.     Tillie Rung, LPN   1/61/0960   After Visit Summary: (MyChart) Due to this being a telephonic visit, the after visit summary with patients personalized plan was offered to patient via MyChart   Nurse Notes: None

## 2022-11-02 NOTE — Patient Instructions (Addendum)
Tyler Andrews , Thank you for taking time to come for your Medicare Wellness Visit. I appreciate your ongoing commitment to your health goals. Please review the following plan we discussed and let me know if I can assist you in the future.   Referrals/Orders/Follow-Ups/Clinician Recommendations:   This is a list of the screening recommended for you and due dates:  Health Maintenance  Topic Date Due   COVID-19 Vaccine (4 - 2023-24 season) 12/03/2021   Flu Shot  11/03/2022   Eye exam for diabetics  11/17/2022   Yearly kidney function blood test for diabetes  02/22/2023   Yearly kidney health urinalysis for diabetes  02/22/2023   Complete foot exam   02/22/2023   Hemoglobin A1C  03/29/2023   Medicare Annual Wellness Visit  11/02/2023   DTaP/Tdap/Td vaccine (3 - Td or Tdap) 02/23/2027   Colon Cancer Screening  02/14/2028   Pneumonia Vaccine  Completed   Hepatitis C Screening  Completed   Zoster (Shingles) Vaccine  Completed   HPV Vaccine  Aged Out    Advanced directives: (Copy Requested) Please bring a copy of your health care power of attorney and living will to the office to be added to your chart at your convenience.  Next Medicare Annual Wellness Visit scheduled for next year: Yes  Preventive Care 9 Years and Older, Male  Preventive care refers to lifestyle choices and visits with your health care provider that can promote health and wellness. What does preventive care include? A yearly physical exam. This is also called an annual well check. Dental exams once or twice a year. Routine eye exams. Ask your health care provider how often you should have your eyes checked. Personal lifestyle choices, including: Daily care of your teeth and gums. Regular physical activity. Eating a healthy diet. Avoiding tobacco and drug use. Limiting alcohol use. Practicing safe sex. Taking low doses of aspirin every day. Taking vitamin and mineral supplements as recommended by your health care  provider. What happens during an annual well check? The services and screenings done by your health care provider during your annual well check will depend on your age, overall health, lifestyle risk factors, and family history of disease. Counseling  Your health care provider may ask you questions about your: Alcohol use. Tobacco use. Drug use. Emotional well-being. Home and relationship well-being. Sexual activity. Eating habits. History of falls. Memory and ability to understand (cognition). Work and work Astronomer. Screening  You may have the following tests or measurements: Height, weight, and BMI. Blood pressure. Lipid and cholesterol levels. These may be checked every 5 years, or more frequently if you are over 71 years old. Skin check. Lung cancer screening. You may have this screening every year starting at age 72 if you have a 30-pack-year history of smoking and currently smoke or have quit within the past 15 years. Fecal occult blood test (FOBT) of the stool. You may have this test every year starting at age 67. Flexible sigmoidoscopy or colonoscopy. You may have a sigmoidoscopy every 5 years or a colonoscopy every 10 years starting at age 12. Prostate cancer screening. Recommendations will vary depending on your family history and other risks. Hepatitis C blood test. Hepatitis B blood test. Sexually transmitted disease (STD) testing. Diabetes screening. This is done by checking your blood sugar (glucose) after you have not eaten for a while (fasting). You may have this done every 1-3 years. Abdominal aortic aneurysm (AAA) screening. You may need this if you are a current  or former smoker. Osteoporosis. You may be screened starting at age 70 if you are at high risk. Talk with your health care provider about your test results, treatment options, and if necessary, the need for more tests. Vaccines  Your health care provider may recommend certain vaccines, such  as: Influenza vaccine. This is recommended every year. Tetanus, diphtheria, and acellular pertussis (Tdap, Td) vaccine. You may need a Td booster every 10 years. Zoster vaccine. You may need this after age 76. Pneumococcal 13-valent conjugate (PCV13) vaccine. One dose is recommended after age 1. Pneumococcal polysaccharide (PPSV23) vaccine. One dose is recommended after age 4. Talk to your health care provider about which screenings and vaccines you need and how often you need them. This information is not intended to replace advice given to you by your health care provider. Make sure you discuss any questions you have with your health care provider. Document Released: 04/17/2015 Document Revised: 12/09/2015 Document Reviewed: 01/20/2015 Elsevier Interactive Patient Education  2017 [employer].  Fall Prevention in the Home Falls can cause injuries. They can happen to people of all ages. There are many things you can do to make your home safe and to help prevent falls. What can I do on the outside of my home? Regularly fix the edges of walkways and driveways and fix any cracks. Remove anything that might make you trip as you walk through a door, such as a raised step or threshold. Trim any bushes or trees on the path to your home. Use bright outdoor lighting. Clear any walking paths of anything that might make someone trip, such as rocks or tools. Regularly check to see if handrails are loose or broken. Make sure that both sides of any steps have handrails. Any raised decks and porches should have guardrails on the edges. Have any leaves, snow, or ice cleared regularly. Use sand or salt on walking paths during winter. Clean up any spills in your garage right away. This includes oil or grease spills. What can I do in the bathroom? Use night lights. Install grab bars by the toilet and in the tub and shower. Do not use towel bars as grab bars. Use non-skid mats or decals in the tub or  shower. If you need to sit down in the shower, use a plastic, non-slip stool. Keep the floor dry. Clean up any water that spills on the floor as soon as it happens. Remove soap buildup in the tub or shower regularly. Attach bath mats securely with double-sided non-slip rug tape. Do not have throw rugs and other things on the floor that can make you trip. What can I do in the bedroom? Use night lights. Make sure that you have a light by your bed that is easy to reach. Do not use any sheets or blankets that are too big for your bed. They should not hang down onto the floor. Have a firm chair that has side arms. You can use this for support while you get dressed. Do not have throw rugs and other things on the floor that can make you trip. What can I do in the kitchen? Clean up any spills right away. Avoid walking on wet floors. Keep items that you use a lot in easy-to-reach places. If you need to reach something above you, use a strong step stool that has a grab bar. Keep electrical cords out of the way. Do not use floor polish or wax that makes floors slippery. If you must use wax,  use non-skid floor wax. Do not have throw rugs and other things on the floor that can make you trip. What can I do with my stairs? Do not leave any items on the stairs. Make sure that there are handrails on both sides of the stairs and use them. Fix handrails that are broken or loose. Make sure that handrails are as long as the stairways. Check any carpeting to make sure that it is firmly attached to the stairs. Fix any carpet that is loose or worn. Avoid having throw rugs at the top or bottom of the stairs. If you do have throw rugs, attach them to the floor with carpet tape. Make sure that you have a light switch at the top of the stairs and the bottom of the stairs. If you do not have them, ask someone to add them for you. What else can I do to help prevent falls? Wear shoes that: Do not have high heels. Have  rubber bottoms. Are comfortable and fit you well. Are closed at the toe. Do not wear sandals. If you use a stepladder: Make sure that it is fully opened. Do not climb a closed stepladder. Make sure that both sides of the stepladder are locked into place. Ask someone to hold it for you, if possible. Clearly mark and make sure that you can see: Any grab bars or handrails. First and last steps. Where the edge of each step is. Use tools that help you move around (mobility aids) if they are needed. These include: Canes. Walkers. Scooters. Crutches. Turn on the lights when you go into a dark area. Replace any light bulbs as soon as they burn out. Set up your furniture so you have a clear path. Avoid moving your furniture around. If any of your floors are uneven, fix them. If there are any pets around you, be aware of where they are. Review your medicines with your doctor. Some medicines can make you feel dizzy. This can increase your chance of falling. Ask your doctor what other things that you can do to help prevent falls. This information is not intended to replace advice given to you by your health care provider. Make sure you discuss any questions you have with your health care provider. Document Released: 01/15/2009 Document Revised: 08/27/2015 Document Reviewed: 04/25/2014 Elsevier Interactive Patient Education  2017 [employer].

## 2022-11-04 NOTE — Progress Notes (Signed)
6CIT was completed during this AWV on 6/31/24 with score of 0.

## 2022-11-09 ENCOUNTER — Other Ambulatory Visit: Payer: Self-pay | Admitting: Family Medicine

## 2022-11-14 ENCOUNTER — Other Ambulatory Visit: Payer: Self-pay | Admitting: Family Medicine

## 2022-11-21 ENCOUNTER — Ambulatory Visit (INDEPENDENT_AMBULATORY_CARE_PROVIDER_SITE_OTHER): Payer: Medicare Other | Admitting: Family Medicine

## 2022-11-21 ENCOUNTER — Encounter: Payer: Self-pay | Admitting: Family Medicine

## 2022-11-21 VITALS — BP 130/60 | HR 100 | Temp 98.0°F | Ht 65.0 in | Wt 300.7 lb

## 2022-11-21 DIAGNOSIS — L299 Pruritus, unspecified: Secondary | ICD-10-CM

## 2022-11-21 DIAGNOSIS — I10 Essential (primary) hypertension: Secondary | ICD-10-CM

## 2022-11-21 MED ORDER — TIRZEPATIDE 15 MG/0.5ML ~~LOC~~ SOAJ: 15.0000 mg | SUBCUTANEOUS | 3 refills | Status: DC

## 2022-11-21 MED ORDER — TRIAMCINOLONE ACETONIDE 0.1 % EX CREA: 1.0000 | TOPICAL_CREAM | Freq: Two times a day (BID) | CUTANEOUS | 2 refills | Status: DC

## 2022-11-21 NOTE — Progress Notes (Signed)
Established Patient Office Visit  Subjective   Patient ID: Tyler Andrews, male    DOB: [birth year]  Age: 67 y.o. MRN: [id number]  Chief Complaint  Patient presents with   Rash    Patient complains of rash on back, x2 weeks, Tried OTC cream     HPI   [name] is here with pruritic rash to left torso for about 3 weeks.  Frequent pruritus.  No pain.  No change in soaps.  Tried over-the-counter hydrocortisone cream without relief.  Itching is intense at times.  No other areas of itching or rash.  He did have recent contact dermatitis which improved with prednisone.  That rash was on his lower extremities.  He has history of morbid obesity, type 2 diabetes, hypertension, hyperlipidemia.  Currently on Mounjaro 12.5 mg subcutaneous weekly.  Has seen some modest weight loss.  Last A1c 6.2%.  He would like to consider further titration.  Occasional mild nausea about 2 days after taking this but tolerating overall fairly well. He does take fairly high dose of Toujeo currently 80 units daily.  No recent hypoglycemic symptoms.  Continues to have some increased gaseous symptoms intermittently.  Recent abdominal ultrasound unremarkable.  No localizing pain.  Has hypertension treated with Norvasc 5 mg daily and lisinopril 20 mg daily.  Denies any peripheral edema or chronic cough or other side effects.  No recent orthostatic symptoms.  Past Medical History:  Diagnosis Date   Chicken pox    Depression    Diabetes mellitus without complication (HCC)    Hypertension    Urinary tract infection    Past Surgical History:  Procedure Laterality Date   COLON SURGERY  02/13/2018   Dr. Gloriajean Dell in [place]   KNEE SURGERY  10/2017   Right knee, tendon rupture    reports that he has never smoked. He has never used smokeless tobacco. He reports that he does not drink alcohol and does not use drugs. family history includes Cancer in his maternal grandfather; Diabetes in his father; Heart disease  (age of onset: 62) in his father. Allergies  Allergen Reactions   Trulicity [Dulaglutide] Nausea Only    Review of Systems  Constitutional:  Negative for malaise/fatigue.  Eyes:  Negative for blurred vision.  Respiratory:  Negative for shortness of breath.   Cardiovascular:  Negative for chest pain.  Neurological:  Negative for dizziness, weakness and headaches.      Objective:     BP 130/60 (BP Location: Left Arm, Patient Position: Sitting, Cuff Size: Large)   Pulse 100   Temp 98 F (36.7 C) (Oral)   Ht 5\' 5"  (1.651 m)   Wt (!) 300 lb 11.2 oz (136.4 kg)   SpO2 97%   BMI 50.04 kg/m  BP Readings from Last 3 Encounters:  11/21/22 130/60  09/27/22 134/62  06/24/22 (!) 158/62   Wt Readings from Last 3 Encounters:  11/21/22 (!) 300 lb 11.2 oz (136.4 kg)  11/02/22 (!) 303 lb (137.4 kg)  09/27/22 (!) 303 lb 12.8 oz (137.8 kg)      Physical Exam Vitals reviewed.  Constitutional:      Appearance: Normal appearance.  Cardiovascular:     Rate and Rhythm: Normal rate and regular rhythm.  Pulmonary:     Effort: Pulmonary effort is normal.     Breath sounds: Normal breath sounds.  Skin:    Comments: Left back exam reveals a few excoriations left thoracic area but no specific rash.  No vesicles.  No pustules.  Nonscaly but slightly dry skin  Neurological:     Mental Status: He is alert.      No results found for any visits on 11/21/22.  Last CBC Lab Results  Component Value Date   WBC 9.1 02/21/2022   HGB 14.6 02/21/2022   HCT 44.2 02/21/2022   MCV 87.6 02/21/2022   RDW 15.5 02/21/2022   PLT 308.0 02/21/2022   Last metabolic panel Lab Results  Component Value Date   GLUCOSE 145 (H) 02/21/2022   NA 138 02/21/2022   K 4.7 02/21/2022   CL 103 02/21/2022   CO2 26 02/21/2022   BUN 16 02/21/2022   CREATININE 0.83 02/21/2022   GFR 90.53 02/21/2022   CALCIUM 8.9 02/21/2022   PROT 6.9 02/21/2022   ALBUMIN 4.0 02/21/2022   BILITOT 0.4 02/21/2022   ALKPHOS 62  02/21/2022   AST 13 02/21/2022   ALT 15 02/21/2022   Last lipids Lab Results  Component Value Date   CHOL 128 02/21/2022   HDL 44.90 02/21/2022   LDLCALC 56 02/21/2022   LDLDIRECT 74.4 05/24/2013   TRIG 139.0 02/21/2022   CHOLHDL 3 02/21/2022   Last hemoglobin A1c Lab Results  Component Value Date   HGBA1C 6.2 (A) 09/27/2022   Last thyroid functions Lab Results  Component Value Date   TSH 1.94 02/21/2022      The ASCVD Risk score (Arnett DK, et al., 2019) failed to calculate for the following reasons:   The valid total cholesterol range is 130 to 320 mg/dL    Assessment & Plan:   #1 acute problem of pruritus and dryness left back region.  Avoid hot showers.  Consider nondrying soap such as Dove.  Triamcinolone 0.1% cream to use twice daily as needed.  Also consider over-the-counter antihistamine such as Allegra or Zyrtec  #2 type 2 diabetes.  History of good control.  He has morbid obesity and is requesting titration of Mounjaro up to 15 mg once weekly.  Watch for any side effects.  Set up follow-up in about 3 months and recheck A1c  #3 hypertension stable on lisinopril.  Continue weight loss efforts.  Watch sodium intake.  Evelena Peat, MD

## 2022-11-21 NOTE — Patient Instructions (Signed)
Consider adding over the counter Zyrtec or Allegra as needed for the itching  Use the prescription steroid cream twice daily.

## 2022-11-24 LAB — HM DIABETES EYE EXAM

## 2022-12-01 ENCOUNTER — Other Ambulatory Visit: Payer: Self-pay | Admitting: Family Medicine

## 2022-12-04 ENCOUNTER — Other Ambulatory Visit: Payer: Self-pay | Admitting: Family Medicine

## 2022-12-08 ENCOUNTER — Other Ambulatory Visit: Payer: Self-pay | Admitting: Family Medicine

## 2022-12-09 ENCOUNTER — Other Ambulatory Visit: Payer: Self-pay

## 2022-12-13 ENCOUNTER — Ambulatory Visit: Payer: Medicare Other | Admitting: Family Medicine

## 2022-12-16 ENCOUNTER — Encounter: Payer: Self-pay | Admitting: Family Medicine

## 2022-12-16 ENCOUNTER — Ambulatory Visit (INDEPENDENT_AMBULATORY_CARE_PROVIDER_SITE_OTHER): Payer: Medicare Other | Admitting: Family Medicine

## 2022-12-16 VITALS — BP 132/62 | HR 78 | Temp 97.5°F | Ht 65.0 in | Wt 303.0 lb

## 2022-12-16 DIAGNOSIS — Z794 Long term (current) use of insulin: Secondary | ICD-10-CM | POA: Diagnosis not present

## 2022-12-16 DIAGNOSIS — L509 Urticaria, unspecified: Secondary | ICD-10-CM

## 2022-12-16 DIAGNOSIS — E1165 Type 2 diabetes mellitus with hyperglycemia: Secondary | ICD-10-CM | POA: Diagnosis not present

## 2022-12-16 MED ORDER — METHYLPREDNISOLONE ACETATE 80 MG/ML IJ SUSP
80.0000 mg | Freq: Once | INTRAMUSCULAR | Status: AC
Start: 2022-12-16 — End: 2022-12-16
  Administered 2022-12-16: 80 mg via INTRAMUSCULAR

## 2022-12-16 NOTE — Patient Instructions (Addendum)
Would consider adding over the counter Pepcid (Famotidine) 20 mg once or twice daily----in addition to the Allegra.    Set up physical for after 02/22/23

## 2022-12-16 NOTE — Progress Notes (Signed)
Established Patient Office Visit  Subjective   Patient ID: Tyler Andrews, male    DOB: [birth year]  Age: 67 y.o. MRN: [id number]  Chief Complaint  Patient presents with   Rash    Patient complains of rash, x5 weeks    HPI   Tyler Andrews is seen with ongoing pruritic rash left side.  He states he sleeps mostly on his left side without a started on at night.  Not aware of any likely contact allergies.  He has been scratching this area intermittently.  He is describing what sounds like urticarial type lesions at times.  Pruritus remains intense at times.  We had suggested Allegra along with topical steroid with triamcinolone but not helping much.  He has type 2 diabetes with improving control.  Recent A1c 6.2%.  We increased his Mounjaro titrating for additional weight loss but has not had any success thus far.  He still remains on multidrug regimen for diabetes including Toujeo, Humalog, Mounjaro, Jardiance, metformin.  Still takes fairly high-dose insulin.  Past Medical History:  Diagnosis Date   Chicken pox    Depression    Diabetes mellitus without complication (HCC)    Hypertension    Urinary tract infection    Past Surgical History:  Procedure Laterality Date   COLON SURGERY  02/13/2018   Dr. Gloriajean Dell in [place]   KNEE SURGERY  10/2017   Right knee, tendon rupture    reports that he has never smoked. He has never used smokeless tobacco. He reports that he does not drink alcohol and does not use drugs. family history includes Cancer in his maternal grandfather; Diabetes in his father; Heart disease (age of onset: 4) in his father. Allergies  Allergen Reactions   Trulicity [Dulaglutide] Nausea Only    Review of Systems  Constitutional:  Negative for chills and fever.  Respiratory:  Negative for cough.   Cardiovascular:  Negative for chest pain.  Skin:  Positive for rash.      Objective:     BP 132/62 (BP Location: Left Arm, Cuff Size: Large)   Pulse 78   Temp  (!) 97.5 F (36.4 C) (Oral)   Ht 5\' 5"  (1.651 m)   Wt (!) 303 lb (137.4 kg)   SpO2 97%   BMI 50.42 kg/m  BP Readings from Last 3 Encounters:  12/16/22 132/62  11/21/22 130/60  09/27/22 134/62   Wt Readings from Last 3 Encounters:  12/16/22 (!) 303 lb (137.4 kg)  11/21/22 (!) 300 lb 11.2 oz (136.4 kg)  11/02/22 (!) 303 lb (137.4 kg)      Physical Exam Vitals reviewed.  Constitutional:      Appearance: He is obese.  Cardiovascular:     Rate and Rhythm: Normal rate and regular rhythm.  Pulmonary:     Effort: Pulmonary effort is normal.     Breath sounds: Normal breath sounds.  Skin:    Comments: He has a few excoriations left lateral thoracic area but no visible rash.  Skin slightly dry.  Neurological:     Mental Status: He is alert.      No results found for any visits on 12/16/22.  Last CBC Lab Results  Component Value Date   WBC 9.1 02/21/2022   HGB 14.6 02/21/2022   HCT 44.2 02/21/2022   MCV 87.6 02/21/2022   RDW 15.5 02/21/2022   PLT 308.0 02/21/2022   Last metabolic panel Lab Results  Component Value Date   GLUCOSE 145 (H) 02/21/2022  NA 138 02/21/2022   K 4.7 02/21/2022   CL 103 02/21/2022   CO2 26 02/21/2022   BUN 16 02/21/2022   CREATININE 0.83 02/21/2022   GFR 90.53 02/21/2022   CALCIUM 8.9 02/21/2022   PROT 6.9 02/21/2022   ALBUMIN 4.0 02/21/2022   BILITOT 0.4 02/21/2022   ALKPHOS 62 02/21/2022   AST 13 02/21/2022   ALT 15 02/21/2022   Last lipids Lab Results  Component Value Date   CHOL 128 02/21/2022   HDL 44.90 02/21/2022   LDLCALC 56 02/21/2022   LDLDIRECT 74.4 05/24/2013   TRIG 139.0 02/21/2022   CHOLHDL 3 02/21/2022   Last hemoglobin A1c Lab Results  Component Value Date   HGBA1C 6.2 (A) 09/27/2022      The ASCVD Risk score (Arnett DK, et al., 2019) failed to calculate for the following reasons:   The valid total cholesterol range is 130 to 320 mg/dL    Assessment & Plan:   #1 intermittent pruritus and rash left  thoracic area.  This sounds more urticarial.  Question pressure induced urticaria. -Continue Allegra daily -Add over-the-counter Pepcid 20 mg 1-2 daily -Depo-Medrol 80 mg IM given  #2 type 2 diabetes.  Improved with multiple medications as above.  We discussed the fact that would like him to scale back his insulin gradually and try going from Humalog 20 units with meals to 15 units.  Set up follow-up around November and recheck full labs then including A1c.   No follow-ups on file.    Evelena Peat, MD

## 2022-12-19 ENCOUNTER — Other Ambulatory Visit: Payer: Self-pay | Admitting: Family Medicine

## 2022-12-26 ENCOUNTER — Other Ambulatory Visit: Payer: Self-pay | Admitting: Family Medicine

## 2022-12-28 ENCOUNTER — Other Ambulatory Visit: Payer: Self-pay | Admitting: Family Medicine

## 2023-01-07 ENCOUNTER — Other Ambulatory Visit: Payer: Self-pay | Admitting: Family Medicine

## 2023-01-11 ENCOUNTER — Encounter: Payer: Self-pay | Admitting: Family Medicine

## 2023-01-17 ENCOUNTER — Encounter: Payer: Self-pay | Admitting: Family Medicine

## 2023-01-17 ENCOUNTER — Ambulatory Visit: Payer: Medicare Other | Admitting: Family Medicine

## 2023-01-17 VITALS — BP 132/68 | HR 84 | Temp 97.7°F | Ht 65.0 in | Wt 297.8 lb

## 2023-01-17 DIAGNOSIS — I10 Essential (primary) hypertension: Secondary | ICD-10-CM | POA: Diagnosis not present

## 2023-01-17 DIAGNOSIS — Z7985 Long-term (current) use of injectable non-insulin antidiabetic drugs: Secondary | ICD-10-CM | POA: Diagnosis not present

## 2023-01-17 DIAGNOSIS — Z23 Encounter for immunization: Secondary | ICD-10-CM

## 2023-01-17 DIAGNOSIS — E1165 Type 2 diabetes mellitus with hyperglycemia: Secondary | ICD-10-CM | POA: Diagnosis not present

## 2023-01-17 DIAGNOSIS — L509 Urticaria, unspecified: Secondary | ICD-10-CM

## 2023-01-17 MED ORDER — HYDROXYZINE HCL 25 MG PO TABS: ORAL_TABLET | ORAL | 1 refills | Status: DC

## 2023-01-17 NOTE — Patient Instructions (Signed)
Let me know if you identify a dermatologist in your region.

## 2023-01-17 NOTE — Progress Notes (Signed)
Established Patient Office Visit  Subjective   Patient ID: Tyler Andrews, male    DOB: 1956/01/20  Age: 67 y.o. MRN: [id number]  Chief Complaint  Patient presents with   Rash    HPI   [name] is seen with some ongoing rash and pruritus mostly involving his left trunk region.  He has been battling this since at least August.  Initially, we thought this was more due to dryness and he was advised to avoid hot showers and to use nondrying soap and we gave some triamcinolone 0.1% cream but he did not see much improvement.  He also took some daily antihistamine.  He was then seen back in follow-up in September and was advised to add Pepcid over-the-counter 20 mg twice daily and Depo-Medrol 80 mg IM given.  Surprisingly, did not see any improvement with the Depo-Medrol.  Continues to have linear raised urticarial type lesions mostly left side which really looks consistent more with dermatographism.  He states he does tend to lay on his left side predominantly.  Has also tried changing soaps and detergents without any improvement.  Does not have any involvement of the lower extremities or arms and relative sparing of the right side.  Longstanding history of type 2 diabetes.  Recent A1c has been well-controlled 6.2%.  He is trying to slowly reduce his Toujeo dosage.  Is on maximum dose of Mounjaro 15 mg subcutaneous once weekly.  Surprisingly, has not lost much weight  He has hypertension treated with amlodipine and lisinopril.  Blood pressures are generally well-controlled.  Up some today but did improve some after rest.  He is still exercising 3 days/week at the gym  Past Medical History:  Diagnosis Date   Chicken pox    Depression    Diabetes mellitus without complication (HCC)    Hypertension    Urinary tract infection    Past Surgical History:  Procedure Laterality Date   COLON SURGERY  02/13/2018   Dr. Gloriajean Dell in [place]   KNEE SURGERY  10/2017   Right knee, tendon rupture     reports that he has never smoked. He has never used smokeless tobacco. He reports that he does not drink alcohol and does not use drugs. family history includes Cancer in his maternal grandfather; Diabetes in his father; Heart disease (age of onset: 68) in his father. Allergies  Allergen Reactions   Trulicity [Dulaglutide] Nausea Only    Review of Systems  Constitutional:  Negative for malaise/fatigue.  Eyes:  Negative for blurred vision.  Respiratory:  Negative for shortness of breath.   Cardiovascular:  Negative for chest pain.  Skin:  Positive for itching and rash.  Neurological:  Negative for dizziness, weakness and headaches.      Objective:     BP 132/68 (BP Location: Left Arm, Cuff Size: Normal)   Pulse 84   Temp 97.7 F (36.5 C) (Oral)   Ht 5\' 5"  (1.651 m)   Wt 297 lb 12.8 oz (135.1 kg)   SpO2 98%   BMI 49.56 kg/m    Physical Exam Vitals reviewed.  Constitutional:      Appearance: He is well-developed.  HENT:     Right Ear: External ear normal.     Left Ear: External ear normal.  Eyes:     Pupils: Pupils are equal, round, and reactive to light.  Neck:     Thyroid: No thyromegaly.  Cardiovascular:     Rate and Rhythm: Normal rate and regular  rhythm.  Pulmonary:     Effort: Pulmonary effort is normal. No respiratory distress.     Breath sounds: Normal breath sounds. No wheezing or rales.  Musculoskeletal:     Cervical back: Neck supple.  Skin:    Findings: Rash present.     Comments: He has several linear raised erythematous lesions right trunk region.  These are nonscaly.  Neurological:     Mental Status: He is alert and oriented to person, place, and time.      No results found for any visits on 01/17/23.  Last CBC Lab Results  Component Value Date   WBC 9.1 02/21/2022   HGB 14.6 02/21/2022   HCT 44.2 02/21/2022   MCV 87.6 02/21/2022   RDW 15.5 02/21/2022   PLT 308.0 02/21/2022   Last metabolic panel Lab Results  Component Value  Date   GLUCOSE 145 (H) 02/21/2022   NA 138 02/21/2022   K 4.7 02/21/2022   CL 103 02/21/2022   CO2 26 02/21/2022   BUN 16 02/21/2022   CREATININE 0.83 02/21/2022   GFR 90.53 02/21/2022   CALCIUM 8.9 02/21/2022   PROT 6.9 02/21/2022   ALBUMIN 4.0 02/21/2022   BILITOT 0.4 02/21/2022   ALKPHOS 62 02/21/2022   AST 13 02/21/2022   ALT 15 02/21/2022   Last lipids Lab Results  Component Value Date   CHOL 128 02/21/2022   HDL 44.90 02/21/2022   LDLCALC 56 02/21/2022   LDLDIRECT 74.4 05/24/2013   TRIG 139.0 02/21/2022   CHOLHDL 3 02/21/2022   Last hemoglobin A1c Lab Results  Component Value Date   HGBA1C 6.2 (A) 09/27/2022      The ASCVD Risk score (Arnett DK, et al., 2019) failed to calculate for the following reasons:   The valid total cholesterol range is 130 to 320 mg/dL    Assessment & Plan:   #1 persistent skin rash right trunk region.  This looks more consistent with urticaria/dermatographism.  Continue daily antihistamine with Allegra or Zyrtec and Pepcid.  Add hydroxyzine 25 mg 1-2 every 8 hours as needed but caution about potential sedation.  Recommend dermatology referral.  He will look into potential referral options closer to where he is living in [place]  #2 hypertension.  Up initially today but then improved after repeat.  Continue weight loss efforts and regular exercise.  Continue amlodipine 5 mg daily and lisinopril 20 mg daily  #3 type 2 diabetes.  Well-controlled by last A1c of 6.2%.  Continue slow tapering of Toujeo as tolerated.  Recheck A1c at next scheduled follow-up  Patient requesting flu vaccine and this is given today   No follow-ups on file.    Evelena Peat, MD

## 2023-01-27 DIAGNOSIS — K219 Gastro-esophageal reflux disease without esophagitis: Secondary | ICD-10-CM | POA: Diagnosis not present

## 2023-02-05 ENCOUNTER — Other Ambulatory Visit: Payer: Self-pay | Admitting: Family Medicine

## 2023-02-07 ENCOUNTER — Other Ambulatory Visit: Payer: Self-pay | Admitting: Family Medicine

## 2023-02-18 ENCOUNTER — Other Ambulatory Visit: Payer: Self-pay | Admitting: Family Medicine

## 2023-02-21 DIAGNOSIS — D485 Neoplasm of uncertain behavior of skin: Secondary | ICD-10-CM | POA: Diagnosis not present

## 2023-02-21 DIAGNOSIS — C44519 Basal cell carcinoma of skin of other part of trunk: Secondary | ICD-10-CM | POA: Diagnosis not present

## 2023-02-21 DIAGNOSIS — D482 Neoplasm of uncertain behavior of peripheral nerves and autonomic nervous system: Secondary | ICD-10-CM | POA: Diagnosis not present

## 2023-02-21 DIAGNOSIS — W57XXXA Bitten or stung by nonvenomous insect and other nonvenomous arthropods, initial encounter: Secondary | ICD-10-CM | POA: Diagnosis not present

## 2023-02-21 DIAGNOSIS — R21 Rash and other nonspecific skin eruption: Secondary | ICD-10-CM | POA: Diagnosis not present

## 2023-02-27 ENCOUNTER — Encounter: Payer: Self-pay | Admitting: Family Medicine

## 2023-02-27 ENCOUNTER — Ambulatory Visit (INDEPENDENT_AMBULATORY_CARE_PROVIDER_SITE_OTHER): Payer: Medicare Other | Admitting: Family Medicine

## 2023-02-27 VITALS — BP 112/60 | HR 76 | Temp 97.7°F | Ht 64.57 in | Wt 299.7 lb

## 2023-02-27 DIAGNOSIS — Z125 Encounter for screening for malignant neoplasm of prostate: Secondary | ICD-10-CM

## 2023-02-27 DIAGNOSIS — R5383 Other fatigue: Secondary | ICD-10-CM | POA: Diagnosis not present

## 2023-02-27 DIAGNOSIS — E1165 Type 2 diabetes mellitus with hyperglycemia: Secondary | ICD-10-CM | POA: Diagnosis not present

## 2023-02-27 DIAGNOSIS — I10 Essential (primary) hypertension: Secondary | ICD-10-CM

## 2023-02-27 DIAGNOSIS — Z0000 Encounter for general adult medical examination without abnormal findings: Secondary | ICD-10-CM

## 2023-02-27 DIAGNOSIS — E785 Hyperlipidemia, unspecified: Secondary | ICD-10-CM

## 2023-02-27 LAB — BASIC METABOLIC PANEL
BUN: 15 mg/dL (ref 6–23)
CO2: 27 meq/L (ref 19–32)
Calcium: 9.2 mg/dL (ref 8.4–10.5)
Chloride: 103 meq/L (ref 96–112)
Creatinine, Ser: 0.82 mg/dL (ref 0.40–1.50)
GFR: 90.22 mL/min (ref >60.00)
Glucose, Bld: 112 mg/dL — ABNORMAL HIGH (ref 70–99)
Potassium: 4.3 meq/L (ref 3.5–5.1)
Sodium: 139 meq/L (ref 135–145)

## 2023-02-27 LAB — HEPATIC FUNCTION PANEL
ALT: 15 U/L (ref 0–53)
AST: 12 U/L (ref 0–37)
Albumin: 4 g/dL (ref 3.5–5.2)
Alkaline Phosphatase: 74 U/L (ref 39–117)
Bilirubin, Direct: 0.1 mg/dL (ref 0.0–0.3)
Total Bilirubin: 0.5 mg/dL (ref 0.2–1.2)
Total Protein: 7 g/dL (ref 6.0–8.3)

## 2023-02-27 LAB — CBC WITH DIFFERENTIAL/PLATELET
Basophils Absolute: 0.1 10*3/uL (ref 0.0–0.1)
Basophils Relative: 0.5 % (ref 0.0–3.0)
Eosinophils Absolute: 0.4 10*3/uL (ref 0.0–0.7)
Eosinophils Relative: 4.4 % (ref 0.0–5.0)
HCT: 45.7 % (ref 39.0–52.0)
Hemoglobin: 14.9 g/dL (ref 13.0–17.0)
Lymphocytes Relative: 19.9 % (ref 12.0–46.0)
Lymphs Abs: 1.8 10*3/uL (ref 0.7–4.0)
MCHC: 32.6 g/dL (ref 30.0–36.0)
MCV: 91.4 fL (ref 78.0–100.0)
Monocytes Absolute: 0.8 10*3/uL (ref 0.1–1.0)
Monocytes Relative: 8.5 % (ref 3.0–12.0)
Neutro Abs: 6.1 10*3/uL (ref 1.4–7.7)
Neutrophils Relative %: 66.7 % (ref 43.0–77.0)
Platelets: 314 10*3/uL (ref 150.0–400.0)
RBC: 4.99 Mil/uL (ref 4.22–5.81)
RDW: 15.6 % — ABNORMAL HIGH (ref 11.5–15.5)
WBC: 9.2 10*3/uL (ref 4.0–10.5)

## 2023-02-27 LAB — MICROALBUMIN / CREATININE URINE RATIO
Creatinine,U: 49.2 mg/dL
Microalb Creat Ratio: 5.1 mg/g (ref 0.0–30.0)
Microalb, Ur: 2.5 mg/dL — ABNORMAL HIGH (ref 0.0–1.9)

## 2023-02-27 LAB — LIPID PANEL
Cholesterol: 143 mg/dL (ref 0–200)
HDL: 45.2 mg/dL (ref >39.00)
LDL Cholesterol: 69 mg/dL (ref 0–99)
NonHDL: 97.51
Total CHOL/HDL Ratio: 3
Triglycerides: 145 mg/dL (ref 0.0–149.0)
VLDL: 29 mg/dL (ref 0.0–40.0)

## 2023-02-27 LAB — HEMOGLOBIN A1C: Hgb A1c MFr Bld: 6.7 % — ABNORMAL HIGH (ref 4.6–6.5)

## 2023-02-27 LAB — PSA, MEDICARE: PSA: 0.65 ng/mL (ref 0.10–4.00)

## 2023-02-27 MED ORDER — LISINOPRIL 30 MG PO TABS: 30.0000 mg | ORAL_TABLET | Freq: Every day | ORAL | 3 refills | Status: DC

## 2023-02-27 NOTE — Progress Notes (Signed)
Established Patient Office Visit  Subjective   Patient ID: Tyler Andrews, male    DOB: 11-24-1955  Age: 67 y.o. MRN: [id number]  Chief Complaint  Patient presents with   Annual Exam    HPI   Tyler Andrews is seen today for physical exam.  Has history of morbid obesity, type 2 diabetes, hyperlipidemia, chronic insomnia, obstructive sleep apnea, hypertension.  He has been battling rash for months.  Went to dermatologist.  They felt like this may be related to amlodipine.  He held amlodipine and started taking 30 mg of lisinopril daily and blood pressures been stable.  Rash currently is improved.  A1c has been well-controlled recently with last A1c 6.2%.  Due for follow-up now.  Had diabetic eye exam this past August with no retinopathy.  He is retired and enjoying retirement overall.  Currently exercising 3 days/week.  Health maintenance reviewed:  Health Maintenance  Topic Date Due   COVID-19 Vaccine (4 - 2023-24 season) 12/04/2022   Diabetic kidney evaluation - eGFR measurement  02/22/2023   Diabetic kidney evaluation - Urine ACR  02/22/2023   HEMOGLOBIN A1C  03/29/2023   Medicare Annual Wellness (AWV)  11/02/2023   OPHTHALMOLOGY EXAM  11/16/2023   FOOT EXAM  02/27/2024   DTaP/Tdap/Td (3 - Td or Tdap) 02/23/2027   Colonoscopy  02/14/2028   Pneumonia Vaccine 65+ Years old  Completed   INFLUENZA VACCINE  Completed   Hepatitis C Screening  Completed   Zoster Vaccines- Shingrix  Completed   HPV VACCINES  Aged Out    Reviewed:  Social History   Socioeconomic History   Marital status: Married    Spouse name: Not on file   Number of children: Not on file   Years of education: Not on file   Highest education level: Associate degree: occupational, Scientist, product/process development, or vocational program  Occupational History   Not on file  Tobacco Use   Smoking status: Never   Smokeless tobacco: Never  Vaping Use   Vaping status: Never Used  Substance and Sexual Activity   Alcohol use: No   Drug  use: No   Sexual activity: Not on file  Other Topics Concern   Not on file  Social History Narrative   Not on file   Social Determinants of Health   Financial Resource Strain: Low Risk  (01/16/2023)   Overall Financial Resource Strain (CARDIA)    Difficulty of Paying Living Expenses: Not very hard  Food Insecurity: No Food Insecurity (01/16/2023)   Hunger Vital Sign    Worried About Running Out of Food in the Last Year: Never true    Ran Out of Food in the Last Year: Never true  Transportation Needs: No Transportation Needs (01/16/2023)   PRAPARE - Administrator, Civil Service (Medical): No    Lack of Transportation (Non-Medical): No  Physical Activity: Sufficiently Active (01/16/2023)   Exercise Vital Sign    Days of Exercise per Week: 3 days    Minutes of Exercise per Session: 50 min  Recent Concern: Physical Activity - Insufficiently Active (11/02/2022)   Exercise Vital Sign    Days of Exercise per Week: 2 days    Minutes of Exercise per Session: 60 min  Stress: No Stress Concern Present (01/16/2023)   [employer] of Occupational Health - Occupational Stress Questionnaire    Feeling of Stress : Only a little  Social Connections: Socially Integrated (01/16/2023)   Social Connection and Isolation Panel [NHANES]    Frequency  of Communication with Friends and Family: Three times a week    Frequency of Social Gatherings with Friends and Family: Once a week    Attends Religious Services: 1 to 4 times per year    Active Member of Clubs or Organizations: No    Attends Engineer, structural: More than 4 times per year    Marital Status: Married  Catering manager Violence: Not At Risk (11/02/2022)   Humiliation, Afraid, Rape, and Kick questionnaire    Fear of Current or Ex-Partner: No    Emotionally Abused: No    Physically Abused: No    Sexually Abused: No   Family History  Problem Relation Age of Onset   Diabetes Father    Heart disease Father 19        CAD   Cancer Maternal Grandfather        prostate   Past Medical History:  Diagnosis Date   Chicken pox    Depression    Diabetes mellitus without complication (HCC)    Hypertension    Urinary tract infection    Past Surgical History:  Procedure Laterality Date   COLON SURGERY  02/13/2018   Dr. Gloriajean Dell in [place]   KNEE SURGERY  10/2017   Right knee, tendon rupture    reports that he has never smoked. He has never used smokeless tobacco. He reports that he does not drink alcohol and does not use drugs. family history includes Cancer in his maternal grandfather; Diabetes in his father; Heart disease (age of onset: 27) in his father. Allergies  Allergen Reactions   Trulicity [Dulaglutide] Nausea Only    Review of Systems  Constitutional:  Positive for malaise/fatigue. Negative for chills and fever.  Eyes:  Negative for blurred vision.  Respiratory:  Negative for shortness of breath.   Cardiovascular:  Negative for chest pain.  Gastrointestinal:  Negative for abdominal pain.  Genitourinary:  Negative for dysuria and hematuria.  Neurological:  Negative for dizziness, weakness and headaches.      Objective:     BP 112/60 (BP Location: Left Arm, Patient Position: Sitting, Cuff Size: Large)   Pulse 76   Temp 97.7 F (36.5 C) (Oral)   Ht 5' 4.57" (1.64 m)   Wt 299 lb 11.2 oz (135.9 kg)   SpO2 98%   BMI 50.54 kg/m  BP Readings from Last 3 Encounters:  02/27/23 112/60  01/17/23 132/68  12/16/22 132/62   Wt Readings from Last 3 Encounters:  02/27/23 299 lb 11.2 oz (135.9 kg)  01/17/23 297 lb 12.8 oz (135.1 kg)  12/16/22 (!) 303 lb (137.4 kg)      Physical Exam Vitals reviewed.  Constitutional:      General: He is not in acute distress. Cardiovascular:     Rate and Rhythm: Normal rate and regular rhythm.  Pulmonary:     Effort: Pulmonary effort is normal.     Breath sounds: Normal breath sounds.  Abdominal:     Palpations: Abdomen is soft.      Tenderness: There is no abdominal tenderness. There is no guarding.     Comments: Soft nontender lipomatous mass just above umbilicus region.  Musculoskeletal:     Cervical back: Neck supple.     Right lower leg: No edema.     Left lower leg: No edema.  Lymphadenopathy:     Cervical: No cervical adenopathy.  Skin:    Comments: Feet reveal no skin lesions. Good distal foot pulses. Good capillary refill. No  calluses. Normal sensation with monofilament testing   Neurological:     General: No focal deficit present.     Mental Status: He is alert.      No results found for any visits on 02/27/23.  Last CBC Lab Results  Component Value Date   WBC 9.1 02/21/2022   HGB 14.6 02/21/2022   HCT 44.2 02/21/2022   MCV 87.6 02/21/2022   RDW 15.5 02/21/2022   PLT 308.0 02/21/2022   Last metabolic panel Lab Results  Component Value Date   GLUCOSE 145 (H) 02/21/2022   NA 138 02/21/2022   K 4.7 02/21/2022   CL 103 02/21/2022   CO2 26 02/21/2022   BUN 16 02/21/2022   CREATININE 0.83 02/21/2022   GFR 90.53 02/21/2022   CALCIUM 8.9 02/21/2022   PROT 6.9 02/21/2022   ALBUMIN 4.0 02/21/2022   BILITOT 0.4 02/21/2022   ALKPHOS 62 02/21/2022   AST 13 02/21/2022   ALT 15 02/21/2022   Last lipids Lab Results  Component Value Date   CHOL 128 02/21/2022   HDL 44.90 02/21/2022   LDLCALC 56 02/21/2022   LDLDIRECT 74.4 05/24/2013   TRIG 139.0 02/21/2022   CHOLHDL 3 02/21/2022   Last hemoglobin A1c Lab Results  Component Value Date   HGBA1C 6.2 (A) 09/27/2022      The ASCVD Risk score (Arnett DK, et al., 2019) failed to calculate for the following reasons:   The valid total cholesterol range is 130 to 320 mg/dL    Assessment & Plan:   Physical exam.  Patient has multiple chronic problems as above.  Controlled type 2 diabetes.  Blood pressure currently controlled on lisinopril 30 mg daily.  Recent discontinuation of amlodipine with possible allergic rash related to this.  We  discussed several health maintenance items as below  -Flu vaccine already given -Pneumonia vaccines complete -Shingrix complete -Colonoscopy due 2029 -Discussed importance of continued regular exercise and overall calorie restriction.  We have encouraged him to try to continue weight loss efforts.  He is on high dose of Mounjaro -Set up labs including A1c and urine microalbumin with his diabetes history -Stop amlodipine and start lisinopril 30 mg daily with new prescription sent.  Monitor blood pressure closely be in touch if systolic readings consistently over 140. -Set up 3 to 44-month follow-up   Return in about 4 months (around 06/27/2023).    Evelena Peat, MD

## 2023-02-27 NOTE — Patient Instructions (Signed)
Stop the Amlodipine and start the Lisinopril 30 mg daily  Monitor blood pressure and be in touch if consistently > 140/90.

## 2023-02-28 ENCOUNTER — Other Ambulatory Visit: Payer: Self-pay | Admitting: Family Medicine

## 2023-02-28 ENCOUNTER — Telehealth: Payer: Self-pay | Admitting: Family Medicine

## 2023-02-28 NOTE — Telephone Encounter (Signed)
Pt called, returning CMA's call. CMA was unavailable. Pt asked that CMA call back at their earliest convenience.

## 2023-02-28 NOTE — Telephone Encounter (Signed)
Please see result note 

## 2023-03-07 ENCOUNTER — Encounter: Payer: Self-pay | Admitting: Family Medicine

## 2023-03-07 DIAGNOSIS — L538 Other specified erythematous conditions: Secondary | ICD-10-CM | POA: Diagnosis not present

## 2023-03-07 DIAGNOSIS — L280 Lichen simplex chronicus: Secondary | ICD-10-CM | POA: Diagnosis not present

## 2023-03-07 DIAGNOSIS — C44519 Basal cell carcinoma of skin of other part of trunk: Secondary | ICD-10-CM | POA: Diagnosis not present

## 2023-03-07 DIAGNOSIS — L0292 Furuncle, unspecified: Secondary | ICD-10-CM | POA: Diagnosis not present

## 2023-03-07 DIAGNOSIS — R21 Rash and other nonspecific skin eruption: Secondary | ICD-10-CM | POA: Diagnosis not present

## 2023-03-07 DIAGNOSIS — L820 Inflamed seborrheic keratosis: Secondary | ICD-10-CM | POA: Diagnosis not present

## 2023-03-07 DIAGNOSIS — L2989 Other pruritus: Secondary | ICD-10-CM | POA: Diagnosis not present

## 2023-03-10 ENCOUNTER — Other Ambulatory Visit: Payer: Self-pay | Admitting: Family Medicine

## 2023-03-15 ENCOUNTER — Other Ambulatory Visit: Payer: Self-pay | Admitting: Family Medicine

## 2023-03-20 DIAGNOSIS — R21 Rash and other nonspecific skin eruption: Secondary | ICD-10-CM | POA: Diagnosis not present

## 2023-03-20 DIAGNOSIS — C44519 Basal cell carcinoma of skin of other part of trunk: Secondary | ICD-10-CM | POA: Diagnosis not present

## 2023-03-22 NOTE — Telephone Encounter (Signed)
Results abstracted and Care Team updated

## 2023-03-29 ENCOUNTER — Encounter: Payer: Self-pay | Admitting: Family Medicine

## 2023-03-31 DIAGNOSIS — L309 Dermatitis, unspecified: Secondary | ICD-10-CM | POA: Diagnosis not present

## 2023-03-31 DIAGNOSIS — R21 Rash and other nonspecific skin eruption: Secondary | ICD-10-CM | POA: Diagnosis not present

## 2023-04-03 MED ORDER — TIRZEPATIDE 5 MG/0.5ML ~~LOC~~ SOAJ: 5.0000 mg | SUBCUTANEOUS | 0 refills | Status: DC

## 2023-04-06 ENCOUNTER — Encounter: Payer: Self-pay | Admitting: Family Medicine

## 2023-04-10 ENCOUNTER — Ambulatory Visit: Payer: Medicare Other | Admitting: Family Medicine

## 2023-04-10 ENCOUNTER — Ambulatory Visit: Payer: Medicare Other | Admitting: Internal Medicine

## 2023-04-11 ENCOUNTER — Ambulatory Visit: Payer: Medicare Other | Admitting: Family Medicine

## 2023-04-15 DIAGNOSIS — J019 Acute sinusitis, unspecified: Secondary | ICD-10-CM | POA: Diagnosis not present

## 2023-04-17 ENCOUNTER — Ambulatory Visit (INDEPENDENT_AMBULATORY_CARE_PROVIDER_SITE_OTHER): Payer: Medicare Other | Admitting: Family Medicine

## 2023-04-17 ENCOUNTER — Encounter: Payer: Self-pay | Admitting: Family Medicine

## 2023-04-17 VITALS — BP 160/60 | HR 116 | Temp 98.1°F | Wt 299.9 lb

## 2023-04-17 DIAGNOSIS — J019 Acute sinusitis, unspecified: Secondary | ICD-10-CM

## 2023-04-17 DIAGNOSIS — I10 Essential (primary) hypertension: Secondary | ICD-10-CM

## 2023-04-17 MED ORDER — AMOXICILLIN-POT CLAVULANATE 875-125 MG PO TABS: 1.0000 | ORAL_TABLET | Freq: Two times a day (BID) | ORAL | 0 refills | Status: DC

## 2023-04-17 MED ORDER — BENZONATATE 100 MG PO CAPS: 100.0000 mg | ORAL_CAPSULE | Freq: Three times a day (TID) | ORAL | 1 refills | Status: DC | PRN

## 2023-04-17 NOTE — Progress Notes (Signed)
 Established Patient Office Visit  Subjective   Patient ID: KRYSTIAN YOUNGLOVE, male    DOB: [birth year]  Age: 68 y.o. MRN: [id number]  Chief Complaint  Patient presents with   Cough    Patient complains of productive cough, x1.5 weeks, Tried dayquil and Tessalon    Ear Pain    HPI   [name] is seen today with some right ear pain, sore throat, cough, sinus congestive symptoms.  Started off more or less like a cold and seemed to be getting some better but then has relapsed past few days.  He has been taking some DayQuil.  Blood pressure usually well-controlled but up today.  Cough has been bothersome at times and interfering with sleep.  He had 1 leftover Tessalon  tablet which seemed to help.  Requesting refill.  He has multiple chronic medical problems including type 2 diabetes with recent A1c 6.7%  Past Medical History:  Diagnosis Date   Chicken pox    Depression    Diabetes mellitus without complication (HCC)    Hypertension    Urinary tract infection    Past Surgical History:  Procedure Laterality Date   COLON SURGERY  02/13/2018   Dr. Eben in [place]   KNEE SURGERY  10/2017   Right knee, tendon rupture    reports that he has never smoked. He has never used smokeless tobacco. He reports that he does not drink alcohol and does not use drugs. family history includes Cancer in his maternal grandfather; Diabetes in his father; Heart disease (age of onset: 24) in his father. Allergies  Allergen Reactions   Trulicity  [Dulaglutide ] Nausea Only    Review of Systems  Constitutional:  Negative for fever and malaise/fatigue.  HENT:  Positive for congestion, sinus pain and sore throat.   Eyes:  Negative for blurred vision.  Respiratory:  Positive for cough. Negative for hemoptysis, shortness of breath and wheezing.   Cardiovascular:  Negative for chest pain.  Neurological:  Negative for dizziness, weakness and headaches.      Objective:     BP (!) 160/60   Pulse (!)  116   Temp 98.1 F (36.7 C) (Oral)   Wt 299 lb 14.4 oz (136 kg)   SpO2 96%   BMI 50.58 kg/m  BP Readings from Last 3 Encounters:  04/17/23 (!) 160/60  02/27/23 112/60  01/17/23 132/68   Wt Readings from Last 3 Encounters:  04/17/23 299 lb 14.4 oz (136 kg)  02/27/23 299 lb 11.2 oz (135.9 kg)  01/17/23 297 lb 12.8 oz (135.1 kg)      Physical Exam Vitals reviewed.  Constitutional:      General: He is not in acute distress.    Appearance: He is not toxic-appearing.  HENT:     Right Ear: Tympanic membrane normal.     Left Ear: Tympanic membrane normal.     Mouth/Throat:     Comments: Mild posterior pharynx erythema.  Oropharynx is moist.  No exudate. Cardiovascular:     Rate and Rhythm: Normal rate and regular rhythm.  Pulmonary:     Effort: Pulmonary effort is normal.     Breath sounds: Normal breath sounds. No wheezing or rales.      No results found for any visits on 04/17/23.  Last CBC Lab Results  Component Value Date   WBC 9.2 02/27/2023   HGB 14.9 02/27/2023   HCT 45.7 02/27/2023   MCV 91.4 02/27/2023   RDW 15.6 (H) 02/27/2023   PLT 314.0 02/27/2023  Last metabolic panel Lab Results  Component Value Date   GLUCOSE 112 (H) 02/27/2023   NA 139 02/27/2023   K 4.3 02/27/2023   CL 103 02/27/2023   CO2 27 02/27/2023   BUN 15 02/27/2023   CREATININE 0.82 02/27/2023   GFR 90.22 02/27/2023   CALCIUM  9.2 02/27/2023   PROT 7.0 02/27/2023   ALBUMIN 4.0 02/27/2023   BILITOT 0.5 02/27/2023   ALKPHOS 74 02/27/2023   AST 12 02/27/2023   ALT 15 02/27/2023   Last lipids Lab Results  Component Value Date   CHOL 143 02/27/2023   HDL 45.20 02/27/2023   LDLCALC 69 02/27/2023   LDLDIRECT 74.4 05/24/2013   TRIG 145.0 02/27/2023   CHOLHDL 3 02/27/2023   Last hemoglobin A1c Lab Results  Component Value Date   HGBA1C 6.7 (H) 02/27/2023      The 10-year ASCVD risk score (Arnett DK, et al., 2019) is: 39.3%    Assessment & Plan:   Acute sinusitis  symptoms.  Patient seemed to be improving after what sounded like typical cold but now worsening symptoms past several days.  Elevated blood pressure today but he took some DayQuil which contains phenylephrine and may be driving this up  -Leave off DayQuil or any other decongestants -Refill Tessalon  Perles 100 mg every 8 hours as needed for cough -Augmentin  875 mg twice daily for 10 days -Follow-up for any persistent or worsening symptoms   Wolm Scarlet, MD

## 2023-04-19 DIAGNOSIS — L820 Inflamed seborrheic keratosis: Secondary | ICD-10-CM | POA: Diagnosis not present

## 2023-04-19 DIAGNOSIS — L270 Generalized skin eruption due to drugs and medicaments taken internally: Secondary | ICD-10-CM | POA: Diagnosis not present

## 2023-04-19 DIAGNOSIS — L2989 Other pruritus: Secondary | ICD-10-CM | POA: Diagnosis not present

## 2023-04-19 DIAGNOSIS — L538 Other specified erythematous conditions: Secondary | ICD-10-CM | POA: Diagnosis not present

## 2023-04-20 ENCOUNTER — Other Ambulatory Visit: Payer: Self-pay | Admitting: Family Medicine

## 2023-04-28 ENCOUNTER — Other Ambulatory Visit: Payer: Self-pay | Admitting: Family Medicine

## 2023-05-01 ENCOUNTER — Ambulatory Visit (INDEPENDENT_AMBULATORY_CARE_PROVIDER_SITE_OTHER): Payer: Medicare Other | Admitting: Family Medicine

## 2023-05-01 ENCOUNTER — Encounter: Payer: Self-pay | Admitting: Family Medicine

## 2023-05-01 VITALS — BP 144/60 | HR 120 | Temp 98.2°F | Wt 304.4 lb

## 2023-05-01 DIAGNOSIS — E1165 Type 2 diabetes mellitus with hyperglycemia: Secondary | ICD-10-CM | POA: Diagnosis not present

## 2023-05-01 DIAGNOSIS — R21 Rash and other nonspecific skin eruption: Secondary | ICD-10-CM | POA: Diagnosis not present

## 2023-05-01 DIAGNOSIS — Z794 Long term (current) use of insulin: Secondary | ICD-10-CM | POA: Diagnosis not present

## 2023-05-01 DIAGNOSIS — L2089 Other atopic dermatitis: Secondary | ICD-10-CM | POA: Diagnosis not present

## 2023-05-01 DIAGNOSIS — L308 Other specified dermatitis: Secondary | ICD-10-CM | POA: Diagnosis not present

## 2023-05-01 DIAGNOSIS — L309 Dermatitis, unspecified: Secondary | ICD-10-CM | POA: Diagnosis not present

## 2023-05-01 MED ORDER — MOUNJARO 5 MG/0.5ML ~~LOC~~ SOAJ: 5.0000 mg | SUBCUTANEOUS | 1 refills | Status: DC

## 2023-05-01 NOTE — Progress Notes (Signed)
Established Patient Office Visit  Subjective   Patient ID: Tyler Andrews, male    DOB: [birth year]  Age: 68 y.o. MRN: [id number]  Chief Complaint  Patient presents with   Rash         HPI   Tyler Andrews is seen today to discuss his persistent diffuse rash extremities and trunk.  He has been followed by dermatologist and rash remains a bit of a mystery.  He had a couple of biopsies now and they are leaning toward this being a drug reaction.  Just had a biopsy earlier today left forearm.  His chronic problems include morbid obesity, hypertension, obstructive sleep apnea, type 2 diabetes, recurrent depression, hyperlipidemia.  He recently was taken off amlodipine briefly as well as Mounjaro but rash did not improve except for the time he was on prednisone.  He was just started on another bout of prednisone 4 days ago.  Expectantly, his blood sugars have shot up since then.  He is fairly consistently running 250-300 and occasionally higher.  He does have Dexcom meter.  His diabetes has been controlled prior to recent prednisone bouts with last A1c 6.7%.  However, this has required multiple medications.  He currently takes Toujeo 90 units subcutaneous once daily, Humalog 20 units 3 times daily with food, metformin XR 500 mg twice daily, Jardiance 25 mg daily.  He is on simvastatin for hyperlipidemia.  Rash is extremely pruritic.  Very bothersome at night.  Frequently taking Atarax at night.  He takes lisinopril 30 mg daily for hypertension.  Blood pressure has been up slightly since starting the prednisone.  No angioedema symptoms.  Past Medical History:  Diagnosis Date   Chicken pox    Depression    Diabetes mellitus without complication (HCC)    Hypertension    Urinary tract infection    Past Surgical History:  Procedure Laterality Date   COLON SURGERY  02/13/2018   Dr. [place] Dell in [place]   KNEE SURGERY  10/2017   Right knee, tendon rupture    reports that he has never  smoked. He has never used smokeless tobacco. He reports that he does not drink alcohol and does not use drugs. family history includes Cancer in his maternal grandfather; Diabetes in his father; Heart disease (age of onset: 80) in his father. Allergies  Allergen Reactions   Trulicity [Dulaglutide] Nausea Only    Review of Systems  Constitutional:  Negative for chills and fever.  Respiratory:  Negative for cough and shortness of breath.   Cardiovascular:  Negative for chest pain.  Skin:  Positive for itching and rash.      Objective:     BP (!) 144/60 (BP Location: Left Arm, Patient Position: Sitting, Cuff Size: Large)   Pulse (!) 120   Temp 98.2 F (36.8 C) (Oral)   Wt (!) 304 lb 6.4 oz (138.1 kg)   SpO2 96%   BMI 51.34 kg/m  BP Readings from Last 3 Encounters:  05/01/23 (!) 144/60  04/17/23 (!) 160/60  02/27/23 112/60   Wt Readings from Last 3 Encounters:  05/01/23 (!) 304 lb 6.4 oz (138.1 kg)  04/17/23 299 lb 14.4 oz (136 kg)  02/27/23 299 lb 11.2 oz (135.9 kg)      Physical Exam Vitals reviewed.  Constitutional:      General: He is not in acute distress.    Appearance: He is not ill-appearing.  Cardiovascular:     Comments: Slightly tachycardic but regular.  Patient states  heart rate has been up since starting prednisone. Pulmonary:     Effort: Pulmonary effort is normal.     Breath sounds: No wheezing or rales.  Skin:    Findings: Rash present.     Comments: Diffuse rash involving his extremities and trunk.  He has some scattered excoriations.  He has erythematous nonscaly urticarial type lesions on his trunk.  Neurological:     Mental Status: He is alert.      No results found for any visits on 05/01/23.  Last CBC Lab Results  Component Value Date   WBC 9.2 02/27/2023   HGB 14.9 02/27/2023   HCT 45.7 02/27/2023   MCV 91.4 02/27/2023   RDW 15.6 (H) 02/27/2023   PLT 314.0 02/27/2023   Last metabolic panel Lab Results  Component Value Date    GLUCOSE 112 (H) 02/27/2023   NA 139 02/27/2023   K 4.3 02/27/2023   CL 103 02/27/2023   CO2 27 02/27/2023   BUN 15 02/27/2023   CREATININE 0.82 02/27/2023   GFR 90.22 02/27/2023   CALCIUM 9.2 02/27/2023   PROT 7.0 02/27/2023   ALBUMIN 4.0 02/27/2023   BILITOT 0.5 02/27/2023   ALKPHOS 74 02/27/2023   AST 12 02/27/2023   ALT 15 02/27/2023   Last lipids Lab Results  Component Value Date   CHOL 143 02/27/2023   HDL 45.20 02/27/2023   LDLCALC 69 02/27/2023   LDLDIRECT 74.4 05/24/2013   TRIG 145.0 02/27/2023   CHOLHDL 3 02/27/2023   Last hemoglobin A1c Lab Results  Component Value Date   HGBA1C 6.7 (H) 02/27/2023   Last thyroid functions Lab Results  Component Value Date   TSH 1.94 02/21/2022      The 10-year ASCVD risk score (Arnett DK, et al., 2019) is: 33.8%    Assessment & Plan:   #1 persistent several month history of diffuse skin rash.  This is pruritic and widespread over his trunk and extremities.  He has seen dermatologist multiple times.  Has not responded to topicals.  Biopsies have reportedly suggested possible drug reaction.  He was taken off amlodipine and Mounjaro without any improvement.  His dermatologist suggested we call and I called but could not get through but left a message  -Recommend hold simvastatin and metformin for now -Start back Mounjaro 5 mg subcutaneous once weekly since rash did not resolve with being off this -For now, increase Humalog to 25 units 3 times daily with meals.  Continue current dose of Toujeo.  Continue close monitoring of blood sugars.  Blood sugars should improve significantly when off prednisone. -We did discuss possible referral to allergist if rash not resolving with discontinuation of medications   #2 type 2 diabetes.  Exacerbated by prednisone-and recently being off Mounjaro.  Had been well-controlled prior to starting prednisone.  Start back [place] as above and give feedback in a month and can continue titration  further at that time.  May have to further titrate his Humalog while he is on the prednisone.  He will give some feedback in a few days if blood sugars not improving with the above.  Would not increase Toujeo further with already high dose of basal insulin.  Evelena Peat, MD

## 2023-05-01 NOTE — Patient Instructions (Addendum)
HOLD the Metformin and Simvastatin.for now.  Continue the Jardiance for now  Increase the Humalog to 25 units three times daily with meals.   Start back the Mounjaro 5 mg [place] once weekly.

## 2023-05-02 ENCOUNTER — Telehealth: Payer: Self-pay | Admitting: Family Medicine

## 2023-05-02 NOTE — Telephone Encounter (Signed)
Aurleigh PA  is calling and would like to speak with md concerning this patient

## 2023-05-03 NOTE — Telephone Encounter (Signed)
Spoke with her last night.

## 2023-05-04 DIAGNOSIS — L309 Dermatitis, unspecified: Secondary | ICD-10-CM | POA: Diagnosis not present

## 2023-05-11 DIAGNOSIS — L2089 Other atopic dermatitis: Secondary | ICD-10-CM | POA: Diagnosis not present

## 2023-05-11 DIAGNOSIS — L218 Other seborrheic dermatitis: Secondary | ICD-10-CM | POA: Diagnosis not present

## 2023-05-18 ENCOUNTER — Other Ambulatory Visit: Payer: Self-pay | Admitting: Family Medicine

## 2023-05-29 ENCOUNTER — Encounter: Payer: Self-pay | Admitting: Family Medicine

## 2023-05-29 ENCOUNTER — Ambulatory Visit (INDEPENDENT_AMBULATORY_CARE_PROVIDER_SITE_OTHER): Payer: Medicare Other | Admitting: Family Medicine

## 2023-05-29 VITALS — BP 132/64 | HR 98 | Temp 97.5°F | Wt 307.4 lb

## 2023-05-29 DIAGNOSIS — L309 Dermatitis, unspecified: Secondary | ICD-10-CM

## 2023-05-29 DIAGNOSIS — E1165 Type 2 diabetes mellitus with hyperglycemia: Secondary | ICD-10-CM | POA: Diagnosis not present

## 2023-05-29 DIAGNOSIS — I10 Essential (primary) hypertension: Secondary | ICD-10-CM | POA: Diagnosis not present

## 2023-05-29 DIAGNOSIS — Z794 Long term (current) use of insulin: Secondary | ICD-10-CM

## 2023-05-29 DIAGNOSIS — E785 Hyperlipidemia, unspecified: Secondary | ICD-10-CM | POA: Diagnosis not present

## 2023-05-29 MED ORDER — TIRZEPATIDE 7.5 MG/0.5ML ~~LOC~~ SOAJ: 7.5000 mg | SUBCUTANEOUS | 2 refills | Status: DC

## 2023-05-29 NOTE — Progress Notes (Signed)
 Established Patient Office Visit  Subjective   Patient ID: Tyler Andrews, male    DOB: 27-Dec-1955  Age: 68 y.o. MRN: [id number]  Chief Complaint  Patient presents with   Medical Management of Chronic Issues    HPI   [name] and has history of morbid obesity, hypertension, obstructive sleep apnea, type 2 diabetes, history of recurrent depression, hyperlipidemia.  He has been battling pruritic diffuse rash for many months.  Has been to dermatologist multiple times and has had multiple biopsies.  Did not respond to topicals or oral medications such as hydroxyzine.  He had a couple of biopsies that suggested possible drug eruption.  He was taken off several medications including simvastatin, metformin, amlodipine and remains off of all these medications.  He was also taken off of Mounjaro but rash persisted.  He has consistently improved short-term with prednisone but each time he has come off prednisone and has had recurrent eruption.  Last rash was about 3 weeks ago.  He does went on Dupixent a few weeks ago.  So far symptoms are relatively stable.  He is currently taking high-dose Humalog with meals and also takes Toujeo.  Blood sugars have been poorly controlled, as expected with recent prednisone.  Last A1c that was 6.7%.  Even when he was taking high-dose Mounjaro previously did not have a lot of success with weight loss which was surprising.  He never felt like this suppressed his appetite much.  Past Medical History:  Diagnosis Date   Chicken pox    Depression    Diabetes mellitus without complication (HCC)    Hypertension    Urinary tract infection    Past Surgical History:  Procedure Laterality Date   COLON SURGERY  02/13/2018   Dr. [place]je[place] Dell in [place]   KNEE SURGERY  10/2017   Right knee, tendon rupture    reports that he has never smoked. He has never used smokeless tobacco. He reports that he does not drink alcohol and does not use drugs. family history includes  Cancer in his maternal grandfather; Diabetes in his father; Heart disease (age of onset: 76) in his father. Allergies  Allergen Reactions   Trulicity [Dulaglutide] Nausea Only    Review of Systems  Constitutional:  Negative for malaise/fatigue.  Eyes:  Negative for blurred vision.  Respiratory:  Negative for shortness of breath.   Cardiovascular:  Negative for chest pain.  Neurological:  Negative for dizziness, weakness and headaches.      Objective:     BP 132/64 (BP Location: Left Arm, Cuff Size: Large)   Pulse 98   Temp (!) 97.5 F (36.4 C) (Oral)   Wt (!) 307 lb 6.4 oz (139.4 kg)   SpO2 95%   BMI 51.84 kg/m  BP Readings from Last 3 Encounters:  05/29/23 132/64  05/01/23 (!) 144/60  04/17/23 (!) 160/60   Wt Readings from Last 3 Encounters:  05/29/23 (!) 307 lb 6.4 oz (139.4 kg)  05/01/23 (!) 304 lb 6.4 oz (138.1 kg)  04/17/23 299 lb 14.4 oz (136 kg)      Physical Exam Vitals reviewed.  Constitutional:      General: He is not in acute distress.    Appearance: He is well-developed.  Eyes:     Pupils: Pupils are equal, round, and reactive to light.  Neck:     Thyroid: No thyromegaly.  Cardiovascular:     Rate and Rhythm: Normal rate and regular rhythm.  Pulmonary:     Effort:  Pulmonary effort is normal. No respiratory distress.     Breath sounds: Normal breath sounds. No wheezing or rales.  Musculoskeletal:     Cervical back: Neck supple.     Right lower leg: No edema.     Left lower leg: No edema.  Neurological:     Mental Status: He is alert and oriented to person, place, and time.      No results found for any visits on 05/29/23.  Last CBC Lab Results  Component Value Date   WBC 9.2 02/27/2023   HGB 14.9 02/27/2023   HCT 45.7 02/27/2023   MCV 91.4 02/27/2023   RDW 15.6 (H) 02/27/2023   PLT 314.0 02/27/2023   Last metabolic panel Lab Results  Component Value Date   GLUCOSE 112 (H) 02/27/2023   NA 139 02/27/2023   K 4.3 02/27/2023   CL  103 02/27/2023   CO2 27 02/27/2023   BUN 15 02/27/2023   CREATININE 0.82 02/27/2023   GFR 90.22 02/27/2023   CALCIUM 9.2 02/27/2023   PROT 7.0 02/27/2023   ALBUMIN 4.0 02/27/2023   BILITOT 0.5 02/27/2023   ALKPHOS 74 02/27/2023   AST 12 02/27/2023   ALT 15 02/27/2023   Last lipids Lab Results  Component Value Date   CHOL 143 02/27/2023   HDL 45.20 02/27/2023   LDLCALC 69 02/27/2023   LDLDIRECT 74.4 05/24/2013   TRIG 145.0 02/27/2023   CHOLHDL 3 02/27/2023   Last hemoglobin A1c Lab Results  Component Value Date   HGBA1C 6.7 (H) 02/27/2023      The 10-year ASCVD risk score (Arnett DK, et al., 2019) is: 29.8%    Assessment & Plan:   #1 persistent diffuse pruritic rash which has been of relatively nonspecific dermatitis type rash.  Question is what is triggering the rash and there is been question of drug induced rash.  He has been taken of multiple medications above including amlodipine, simvastatin, and metformin to no avail.  Also was off GLP-1 medication with Mounjaro for several weeks without any improvement.  Did recently go on Dupixent and has pending skin patch testing coming up. -Consider referral to allergist if symptoms not improving with Dupixent  #2 hypertension.  Currently on lisinopril.  Recently taken off amlodipine.  Blood pressure up a bit today but fairly well-controlled by home readings  #3 type 2 diabetes.  Taken off several medications including metformin and Mounjaro recently.  Suspect A1c will be up today with recent prednisone use.  We decided not to check A1c but weight for couple months and then reassess.  Continue to hold metformin for now.  Currently tolerating Mounjaro 5 mg once weekly.  Will bump this up to 7.5 mg with new prescription sent  #4 hyperlipidemia.  Patient needs to be on a statin eventually.  We decided to hold for now until his rash settles down a bit.  Would consider going back on another statin other than simvastatin when he does  resume statin use Evelena Peat, MD

## 2023-06-07 ENCOUNTER — Other Ambulatory Visit: Payer: Self-pay | Admitting: Family Medicine

## 2023-06-07 NOTE — Telephone Encounter (Signed)
 Copied from CRM [id number]. Topic: Clinical - Medication Refill >> Jun 07, 2023 10:01 AM Pascal Lux wrote: Most Recent Primary Care Visit:  Provider: Kristian Covey  Department: [facility]  Visit Type: OFFICE VISIT  Date: 05/29/2023  Medication: insulin lispro (HUMALOG) 100 UNIT/ML KwikPen [[id number]]  Has the patient contacted their pharmacy? Yes (Agent: If no, request that the patient contact the pharmacy for the refill. If patient does not wish to contact the pharmacy document the reason why and proceed with request.) (Agent: If yes, when and what did the pharmacy advise?) they advised to call provider  Is this the correct pharmacy for this prescription? Yes If no, delete pharmacy and type the correct one.  This is the patient's preferred pharmacy:  [facility] [address] [place] - [address] [place] [address] Phone: [phone] Fax: [phone]   Has the prescription been filled recently? No  Is the patient out of the medication? Yes, will be out tomorrow  Has the patient been seen for an appointment in the last year OR does the patient have an upcoming appointment? Yes  Can we respond through MyChart? Yes  Agent: Please be advised that Rx refills may take up to 3 business days. We ask that you follow-up with your pharmacy.

## 2023-06-08 ENCOUNTER — Other Ambulatory Visit: Payer: Self-pay | Admitting: Family Medicine

## 2023-06-08 MED ORDER — INSULIN LISPRO (1 UNIT DIAL) 100 UNIT/ML (KWIKPEN): PEN_INJECTOR | SUBCUTANEOUS | 3 refills | Status: DC

## 2023-06-09 ENCOUNTER — Ambulatory Visit: Admitting: Family Medicine

## 2023-06-09 ENCOUNTER — Encounter: Payer: Self-pay | Admitting: Family Medicine

## 2023-06-09 VITALS — BP 140/68 | HR 76 | Temp 97.7°F | Wt 304.7 lb

## 2023-06-09 DIAGNOSIS — N6312 Unspecified lump in the right breast, upper inner quadrant: Secondary | ICD-10-CM | POA: Diagnosis not present

## 2023-06-09 NOTE — Progress Notes (Signed)
 Established Patient Office Visit  Subjective   Patient ID: Tyler Andrews, male    DOB: [birth year]  Age: 68 y.o. MRN: [id number]  Chief Complaint  Patient presents with   Mass    HPI   Tyler Andrews is seen to evaluate right breast mass.  First noted about 6 weeks ago.  He has noted a little bit of overlying erythema but no real cellulitis changes.  No warmth.  Denies any injury.  No axillary adenopathy.  No nipple discharge.  Appetite and weight stable.  He noticed this started about the time he started Dupixent injections per dermatology but he is not sure there is any correlation.  His chronic problems include morbid obesity, hypertension, obstructive sleep apnea, type 2 diabetes, history of recurrent depression, hyperlipidemia  Past Medical History:  Diagnosis Date   Chicken pox    Depression    Diabetes mellitus without complication (HCC)    Hypertension    Urinary tract infection    Past Surgical History:  Procedure Laterality Date   COLON SURGERY  02/13/2018   Dr. [place] Dell in [place]   KNEE SURGERY  10/2017   Right knee, tendon rupture    reports that he has never smoked. He has never used smokeless tobacco. He reports that he does not drink alcohol and does not use drugs. family history includes Cancer in his maternal grandfather; Diabetes in his father; Heart disease (age of onset: 58) in his father. Allergies  Allergen Reactions   Trulicity [Dulaglutide] Nausea Only    Review of Systems  Constitutional:  Negative for chills, fever and weight loss.  Respiratory:  Negative for shortness of breath.   Cardiovascular:  Negative for chest pain.      Objective:     BP (!) 140/68 (BP Location: Left Arm, Patient Position: Sitting, Cuff Size: Large)   Pulse 76   Temp 97.7 F (36.5 C) (Oral)   Wt (!) 304 lb 11.2 oz (138.2 kg)   SpO2 97%   BMI 51.39 kg/m  BP Readings from Last 3 Encounters:  06/09/23 (!) 140/68  05/29/23 132/64  05/01/23 (!) 144/60   Wt  Readings from Last 3 Encounters:  06/09/23 (!) 304 lb 11.2 oz (138.2 kg)  05/29/23 (!) 307 lb 6.4 oz (139.4 kg)  05/01/23 (!) 304 lb 6.4 oz (138.1 kg)      Physical Exam Vitals reviewed.  Constitutional:      General: He is not in acute distress.    Appearance: He is not ill-appearing.  Cardiovascular:     Rate and Rhythm: Normal rate and regular rhythm.  Pulmonary:     Effort: Pulmonary effort is normal.     Breath sounds: Normal breath sounds. No wheezing or rales.     Comments: Breast exam reveals palpated slightly mobile fairly symmetric and rounded and slightly oblong shaped mass left breast upper medial quadrant around 1-2 o'clock position.  This is approximately 1-1/2 x 2 cm in dimension.    no axillary adenopathy noted. No nipple inversion. Neurological:     Mental Status: He is alert.      No results found for any visits on 06/09/23.    The 10-year ASCVD risk score (Arnett DK, et al., 2019) is: 32.5%    Assessment & Plan:   Problem List Items Addressed This Visit   None Visit Diagnoses       Mass of right breast, unspecified quadrant    -  Primary   Relevant Orders  MM Digital Diagnostic Unilat R   US BREAST COMPLETE UNI RIGHT INC AXILLA     Right breast mass first noted about 6 weeks ago.  Patient is concerned this is increasing in size.  Recommend setting up diagnostic mammogram along with ultrasound to further assess.  Patient agrees.  No follow-ups on file.    Evelena Peat, MD

## 2023-06-09 NOTE — Patient Instructions (Signed)
 We are setting up diagnostic mammogram and ultrasound  Let me know if you don't hear back anything in 1-2 weeks.

## 2023-06-12 DIAGNOSIS — L239 Allergic contact dermatitis, unspecified cause: Secondary | ICD-10-CM | POA: Diagnosis not present

## 2023-06-14 ENCOUNTER — Other Ambulatory Visit: Payer: Self-pay | Admitting: Family Medicine

## 2023-06-14 DIAGNOSIS — L239 Allergic contact dermatitis, unspecified cause: Secondary | ICD-10-CM | POA: Diagnosis not present

## 2023-06-16 DIAGNOSIS — L239 Allergic contact dermatitis, unspecified cause: Secondary | ICD-10-CM | POA: Diagnosis not present

## 2023-06-19 ENCOUNTER — Other Ambulatory Visit: Payer: Self-pay | Admitting: Family Medicine

## 2023-07-03 ENCOUNTER — Ambulatory Visit
Admission: RE | Admit: 2023-07-03 | Discharge: 2023-07-03 | Disposition: A | Discharge status: Home / Self Care | Source: Ambulatory Visit | Attending: Family Medicine | Admitting: Family Medicine

## 2023-07-03 ENCOUNTER — Other Ambulatory Visit: Payer: Self-pay | Admitting: Family Medicine

## 2023-07-03 DIAGNOSIS — N6312 Unspecified lump in the right breast, upper inner quadrant: Secondary | ICD-10-CM

## 2023-07-03 DIAGNOSIS — N641 Fat necrosis of breast: Secondary | ICD-10-CM

## 2023-07-14 ENCOUNTER — Other Ambulatory Visit: Payer: Self-pay | Admitting: Family Medicine

## 2023-07-26 ENCOUNTER — Ambulatory Visit (INDEPENDENT_AMBULATORY_CARE_PROVIDER_SITE_OTHER): Admitting: Family Medicine

## 2023-07-26 ENCOUNTER — Encounter: Payer: Self-pay | Admitting: Family Medicine

## 2023-07-26 VITALS — BP 126/60 | HR 89 | Temp 97.9°F | Wt 306.1 lb

## 2023-07-26 DIAGNOSIS — Z794 Long term (current) use of insulin: Secondary | ICD-10-CM | POA: Diagnosis not present

## 2023-07-26 DIAGNOSIS — E1165 Type 2 diabetes mellitus with hyperglycemia: Secondary | ICD-10-CM | POA: Diagnosis not present

## 2023-07-26 LAB — POCT GLYCOSYLATED HEMOGLOBIN (HGB A1C): Hemoglobin A1C: 6.4 % — AB (ref 4.0–5.6)

## 2023-07-26 NOTE — Patient Instructions (Signed)
 Come fasting at next visit and will plan to check lipids then  We need to consider re-initiating statin at that time.

## 2023-07-26 NOTE — Progress Notes (Signed)
 Established Patient Office Visit  Subjective   Patient ID: LASHUN MCCANTS, male    DOB: May 26, 1955  Age: 68 y.o. MRN: [id number]  Chief Complaint  Patient presents with   Medical Management of Chronic Issues    HPI   Lesia Raring is seen for medical follow-up.  He had some ongoing issues with pruritic rash most involving his trunk.  Extensive dermatologic workup.  Currently on Dupixent.  Previous biopsy showed possible drug reaction.  He was taken off several medications including metformin , Mounjaro , simvastatin  but symptoms have not improved with discontinuation of medications.  He is now back on Mounjaro  7.5 mg daily.  He had noticed thickened area right breast upper inner quadrant.  This was easy to palpate on exam.  We referred him for diagnostic mammogram and ultrasound and this showed probable area of fat necrosis upper inner right breast.  Suggestion for possible 96-month follow-up.  He has not noted any changes.  He is continue to struggle with weight.  Even with Mounjaro  at 15 mg did not see much weight loss.  Does take Paxil  and has been on this for years.  We have discussed many times this may be making weight loss more difficult for him  Past Medical History:  Diagnosis Date   Chicken pox    Depression    Diabetes mellitus without complication (HCC)    Hypertension    Urinary tract infection    Past Surgical History:  Procedure Laterality Date   COLON SURGERY  02/13/2018   Dr. Gaynell Keeler in [place]   KNEE SURGERY  10/2017   Right knee, tendon rupture    reports that he has never smoked. He has never used smokeless tobacco. He reports that he does not drink alcohol and does not use drugs. family history includes Cancer in his maternal grandfather; Diabetes in his father; Heart disease (age of onset: 45) in his father. Allergies  Allergen Reactions   Trulicity  [Dulaglutide ] Nausea Only    Review of Systems  Constitutional:  Negative for malaise/fatigue.  Eyes:   Negative for blurred vision.  Respiratory:  Negative for shortness of breath.   Cardiovascular:  Negative for chest pain.  Neurological:  Negative for dizziness, weakness and headaches.      Objective:     BP 126/60 (BP Location: Left Arm, Patient Position: Sitting, Cuff Size: Large)   Pulse 89   Temp 97.9 F (36.6 C) (Oral)   Wt (!) 306 lb 1.6 oz (138.8 kg)   SpO2 95%   BMI 51.62 kg/m  BP Readings from Last 3 Encounters:  07/26/23 126/60  06/09/23 (!) 140/68  05/29/23 132/64   Wt Readings from Last 3 Encounters:  07/26/23 (!) 306 lb 1.6 oz (138.8 kg)  06/09/23 (!) 304 lb 11.2 oz (138.2 kg)  05/29/23 (!) 307 lb 6.4 oz (139.4 kg)      Physical Exam Vitals reviewed.  Constitutional:      General: He is not in acute distress.    Appearance: He is well-developed. He is not ill-appearing.  HENT:     Right Ear: External ear normal.     Left Ear: External ear normal.  Eyes:     Pupils: Pupils are equal, round, and reactive to light.  Neck:     Thyroid : No thyromegaly.  Cardiovascular:     Rate and Rhythm: Normal rate and regular rhythm.  Pulmonary:     Effort: Pulmonary effort is normal. No respiratory distress.     Breath sounds:  Normal breath sounds. No wheezing or rales.     Comments: Right upper inner breast reveals well-circumscribed proximately 1 x 1 cm nontender subcutaneous nodular density Musculoskeletal:     Cervical back: Neck supple.  Neurological:     Mental Status: He is alert and oriented to person, place, and time.      Results for orders placed or performed in visit on 07/26/23  POC HgB A1c  Result Value Ref Range   Hemoglobin A1C 6.4 (A) 4.0 - 5.6 %   HbA1c POC (<> result, manual entry)     HbA1c, POC (prediabetic range)     HbA1c, POC (controlled diabetic range)        The 10-year ASCVD risk score (Arnett DK, et al., 2019) is: 27.7%    Assessment & Plan:   #1 type 2 diabetes controlled with A1c today 6.4%.  We discussed the fact that  we could titrate his Mounjaro  further but this will be really more for weight control.  Since he did not see much weight loss previously with higher dose we decided to leave current dosage.  Continue other diabetic medications including Toujeo , Humalog , and Jardiance .  Will continue to hold metformin  for now since he is controlled  #2 hyperlipidemia.  Off simvastatin  now for a few months.  Doubt this is triggering his rash since he is continue to have some issues even on Dupixent.  Patient would like to get follow-up fasting lipid panel first before considering another statin.  Plan to get fasting labs in 3 months with lipids.  Consider another statin other than simvastatin  if reinitiating such as rosuvastatin  #3 right breast mass.  Recent imaging reassuring.  Discuss follow-up imaging at next visit in 3 months  #4 morbid obesity.  Has not seen much response with GLP-1 medications in terms of weight loss in the past.  We have encouraged him to consider possible slow taper off Paxil  to see if this helps and he will consider   Glean Lamy, MD

## 2023-07-29 ENCOUNTER — Other Ambulatory Visit: Payer: Self-pay | Admitting: Family Medicine

## 2023-08-08 ENCOUNTER — Other Ambulatory Visit: Payer: Self-pay | Admitting: Family Medicine

## 2023-08-12 ENCOUNTER — Other Ambulatory Visit: Payer: Self-pay | Admitting: Family Medicine

## 2023-08-15 ENCOUNTER — Other Ambulatory Visit: Payer: Self-pay | Admitting: Family Medicine

## 2023-08-30 ENCOUNTER — Other Ambulatory Visit: Payer: Self-pay | Admitting: Family Medicine

## 2023-09-11 ENCOUNTER — Encounter: Payer: Self-pay | Admitting: Family Medicine

## 2023-09-11 ENCOUNTER — Other Ambulatory Visit: Payer: Self-pay | Admitting: Family Medicine

## 2023-09-11 MED ORDER — HYDROXYZINE HCL 25 MG PO TABS: ORAL_TABLET | ORAL | 0 refills | Status: DC

## 2023-09-12 ENCOUNTER — Other Ambulatory Visit: Payer: Self-pay | Admitting: Family Medicine

## 2023-09-12 MED ORDER — TOUJEO SOLOSTAR 300 UNIT/ML ~~LOC~~ SOPN: PEN_INJECTOR | SUBCUTANEOUS | 1 refills | Status: DC

## 2023-09-20 DIAGNOSIS — L218 Other seborrheic dermatitis: Secondary | ICD-10-CM | POA: Diagnosis not present

## 2023-09-20 DIAGNOSIS — L2089 Other atopic dermatitis: Secondary | ICD-10-CM | POA: Diagnosis not present

## 2023-09-20 DIAGNOSIS — L820 Inflamed seborrheic keratosis: Secondary | ICD-10-CM | POA: Diagnosis not present

## 2023-10-14 ENCOUNTER — Other Ambulatory Visit: Payer: Self-pay | Admitting: Family Medicine

## 2023-10-29 ENCOUNTER — Encounter: Payer: Self-pay | Admitting: Family Medicine

## 2023-10-30 MED ORDER — HYDROXYZINE HCL 25 MG PO TABS: ORAL_TABLET | ORAL | 0 refills | Status: DC

## 2023-11-03 ENCOUNTER — Other Ambulatory Visit: Payer: Self-pay | Admitting: Family Medicine

## 2023-11-06 ENCOUNTER — Ambulatory Visit (INDEPENDENT_AMBULATORY_CARE_PROVIDER_SITE_OTHER)

## 2023-11-06 VITALS — Ht 66.0 in | Wt 306.0 lb

## 2023-11-06 DIAGNOSIS — Z0000 Encounter for general adult medical examination without abnormal findings: Secondary | ICD-10-CM | POA: Diagnosis not present

## 2023-11-06 NOTE — Progress Notes (Signed)
 Subjective:   Tyler Andrews is a 68 y.o. who presents for a Medicare Wellness preventive visit.  As a reminder, Annual Wellness Visits don't include a physical exam, and some assessments may be limited, especially if this visit is performed virtually. We may recommend an in-person follow-up visit with your provider if needed.  Visit Complete: Virtual I connected with  Tyler Andrews on 11/06/23 by a audio enabled telemedicine application and verified that I am speaking with the correct person using two identifiers.  Patient Location: Home  Provider Location: Home Office  I discussed the limitations of evaluation and management by telemedicine. The patient expressed understanding and agreed to proceed.  Vital Signs: Because this visit was a virtual/telehealth visit, some criteria may be missing or patient reported. Any vitals not documented were not able to be obtained and vitals that have been documented are patient reported.    Persons Participating in Visit: Patient.  AWV Questionnaire: Yes: Patient Medicare AWV questionnaire was completed by the patient on 11/03/23; I have confirmed that all information answered by patient is correct and no changes since this date.  Cardiac Risk Factors include: advanced age (>82men, >24 women);male gender;diabetes mellitus;hypertension     Objective:    Today's Vitals   11/06/23 1035  Weight: (!) 306 lb (138.8 kg)  Height: 5' 6 (1.676 m)   Body mass index is 49.39 kg/m.     11/06/2023   10:41 AM 11/02/2022    1:12 PM 11/23/2021    9:34 AM  Advanced Directives  Does Patient Have a Medical Advance Directive? Yes Yes Yes  Type of Estate agent of [place];Living will Healthcare Power of [place];Living will Healthcare Power of [place];Living will  Copy of Healthcare Power of Attorney in Chart? No - copy requested No - copy requested No - copy requested    Current Medications (verified) Outpatient Encounter  Medications as of 11/06/2023  Medication Sig   acyclovir  (ZOVIRAX ) 800 MG tablet Take 1 tablet by mouth twice daily   aspirin  81 MG tablet Take 81 mg by mouth daily.   Continuous Blood Gluc Receiver (DEXCOM G6 RECEIVER) DEVI Use as directed to check blood sugar daily   Continuous Glucose Sensor (DEXCOM G6 SENSOR) MISC USE AS DIRECTED TO  CHECK  BLOOD  SUGARS  DAILY  CHANGE  SENSOR  EVERY  10  DAYS   Continuous Glucose Transmitter (DEXCOM G6 TRANSMITTER) MISC USE AS DIRECTED TO CHECK BLOOD SUGAR DAILY   famotidine (PEPCID) 40 MG tablet Take 40 mg by mouth at bedtime.   fluticasone  (FLONASE ) 50 MCG/ACT nasal spray Place 2 sprays into both nostrils daily.   hydrOXYzine  (ATARAX ) 25 MG tablet TAKE 1 TO 2 TABLETS BY MOUTH EVERY 8 HOURS AS NEEDED FOR ITCHING   insulin  glargine, 1 Unit Dial , (TOUJEO  SOLOSTAR) 300 UNIT/ML Solostar Pen INJECT 90 UNITS SUBCUTANEOUSLY ONCE DAILY . APPOINTMENT REQUIRED FOR FUTURE REFILLS   insulin  lispro (HUMALOG ) 100 UNIT/ML KwikPen INJECT 20 UNITS SUBCUTANEOUSLY THREE TIMES DAILY WITH MEALS   JARDIANCE  25 MG TABS tablet TAKE 1 TABLET BY MOUTH ONCE DAILY BEFORE BREAKFAST   ketoconazole  (NIZORAL ) 2 % cream Apply 1 Application topically 2 (two) times daily as needed for irritation.   lisinopril  (ZESTRIL ) 30 MG tablet Take 1 tablet (30 mg total) by mouth daily.   LORazepam  (ATIVAN ) 0.5 MG tablet Take 1 tablet (0.5 mg total) by mouth every 8 (eight) hours as needed for anxiety.   Misc. Devices MISC Initiate Auto C-pap 5-15 cm  with mask and supplies   PARoxetine  (PAXIL ) 20 MG tablet Take 1 tablet by mouth once daily   tirzepatide  (MOUNJARO ) 7.5 MG/0.5ML Pen Inject 7.5 mg into the skin once a week.   triamcinolone  cream (KENALOG ) 0.1 % APPLY 1 CREAM EXTERNALLY TWICE DAILY   No facility-administered encounter medications on file as of 11/06/2023.    Allergies (verified) Trulicity  [dulaglutide ]   History: Past Medical History:  Diagnosis Date   Chicken pox    Depression     Diabetes mellitus without complication (HCC)    Hypertension    Urinary tract infection    Past Surgical History:  Procedure Laterality Date   COLON SURGERY  02/13/2018   Dr. Eben in [place]   KNEE SURGERY  10/2017   Right knee, tendon rupture   Family History  Problem Relation Age of Onset   Diabetes Father    Heart disease Father 57       CAD   Cancer Maternal Grandfather        prostate   Social History   Socioeconomic History   Marital status: Married    Spouse name: Not on file   Number of children: Not on file   Years of education: Not on file   Highest education level: Associate degree: occupational, Scientist, product/process development, or vocational program  Occupational History   Not on file  Tobacco Use   Smoking status: Never   Smokeless tobacco: Never  Vaping Use   Vaping status: Never Used  Substance and Sexual Activity   Alcohol use: No   Drug use: No   Sexual activity: Not on file  Other Topics Concern   Not on file  Social History Narrative   Not on file   Social Drivers of Health   Financial Resource Strain: Low Risk  (11/06/2023)   Overall Financial Resource Strain (CARDIA)    Difficulty of Paying Living Expenses: Not hard at all  Food Insecurity: No Food Insecurity (11/06/2023)   Hunger Vital Sign    Worried About Running Out of Food in the Last Year: Never true    Ran Out of Food in the Last Year: Never true  Transportation Needs: No Transportation Needs (11/06/2023)   PRAPARE - Administrator, Civil Service (Medical): No    Lack of Transportation (Non-Medical): No  Physical Activity: Sufficiently Active (11/06/2023)   Exercise Vital Sign    Days of Exercise per Week: 3 days    Minutes of Exercise per Session: 50 min  Stress: No Stress Concern Present (11/06/2023)   [employer] of Occupational Health - Occupational Stress Questionnaire    Feeling of Stress: Not at all  Social Connections: Socially Integrated (11/06/2023)   Social Connection and  Isolation Panel    Frequency of Communication with Friends and Family: More than three times a week    Frequency of Social Gatherings with Friends and Family: Twice a week    Attends Religious Services: More than 4 times per year    Active Member of [employer] or Organizations: Yes    Attends Engineer, structural: More than 4 times per year    Marital Status: Married    Tobacco Counseling Counseling given: Not Answered    Clinical Intake:  Pre-visit preparation completed: Yes  Pain : No/denies pain     BMI - recorded: 49.39 Nutritional Status: BMI > 30  Obese Nutritional Risks: None Diabetes: Yes CBG done?: Yes (CBG 130 per patient) CBG resulted in Enter/ Edit results?: Yes  Did pt. bring in CBG monitor from home?: No  Lab Results  Component Value Date   HGBA1C 6.4 (A) 07/26/2023   HGBA1C 6.7 (H) 02/27/2023   HGBA1C 6.2 (A) 09/27/2022     How often do you need to have someone help you when you read instructions, pamphlets, or other written materials from your doctor or pharmacy?: 1 - Never  Interpreter Needed?: No  Information entered by :: Rojelio Blush LPN   Activities of Daily Living     11/06/2023   10:40 AM 11/03/2023    9:24 AM  In your present state of health, do you have any difficulty performing the following activities:  Hearing? 0 0  Vision? 0 0  Difficulty concentrating or making decisions? 0 0  Walking or climbing stairs? 0 0  Dressing or bathing? 0 0  Doing errands, shopping? 0 0  Preparing Food and eating ? N N  Using the Toilet? N N  In the past six months, have you accidently leaked urine? N N  Do you have problems with loss of bowel control? N N  Managing your Medications? N N  Managing your Finances? N N  Housekeeping or managing your Housekeeping? N N    Patient Care Team: Micheal Wolm ORN, MD as PCP - General (Family Medicine) Alvia Olam BIRCH, RN as [facility] [employer]  I have  updated your Care Teams any recent Medical Services you may have received from other providers in the past year.     Assessment:   This is a routine wellness examination for [place].  Hearing/Vision screen Hearing Screening - Comments:: Denies hearing difficulties   Vision Screening - Comments:: Wears rx glasses - up to date with routine eye exams with  [facility]   Goals Addressed               This Visit's Progress     Increase physical activity (pt-stated)        Lose weight       Depression Screen     11/06/2023   10:42 AM 11/02/2022    1:10 PM 02/21/2022    9:02 AM 12/30/2021   10:06 AM 11/23/2021    9:35 AM 02/17/2021    7:58 AM 05/11/2015    3:13 PM  PHQ 2/9 Scores  PHQ - 2 Score 0 0 2 0 0 2 0  PHQ- 9 Score   7   7     Fall Risk     11/06/2023   10:40 AM 11/03/2023    9:24 AM 11/02/2022    1:12 PM 11/01/2022    3:53 PM 06/24/2022    2:47 PM  Fall Risk   Falls in the past year? 0 0 0 0 0  Number falls in past yr: 0 0 0 0 0  Injury with Fall? 0 0 0 0 0  Risk for fall due to : No Fall Risks  No Fall Risks  No Fall Risks  Follow up Falls evaluation completed  Falls prevention discussed  Falls evaluation completed    MEDICARE RISK AT HOME:  Medicare Risk at Home Any stairs in or around the home?: Yes If so, are there any without handrails?: No Home free of loose throw rugs in walkways, pet beds, electrical cords, etc?: Yes Adequate lighting in your home to reduce risk of falls?: Yes Life alert?: No Use of a cane, walker or w/c?: No Grab bars in the bathroom?: Yes Shower chair  or bench in shower?: No Elevated toilet seat or a handicapped toilet?: Yes  TIMED UP AND GO:  Was the test performed?  No  Cognitive Function: 6CIT completed        11/06/2023   10:40 AM 11/04/2022    8:01 AM 11/23/2021    9:38 AM  6CIT Screen  What Year? 0 points 0 points 0 points  What month? 0 points 0 points 0 points  What time? 0 points 0 points 0 points  Count back from  20 0 points 0 points 0 points  Months in reverse 0 points 0 points 0 points  Repeat phrase 0 points 0 points 0 points  Total Score 0 points 0 points 0 points    Immunizations Immunization History  Administered Date(s) Administered   Fluad Quad(high Dose 65+) 01/21/2020, 04/01/2022   Fluad Trivalent(High Dose 65+) 01/17/2023   Influenza Whole 01/16/2013   Influenza, High Dose Seasonal PF 03/22/2022   Influenza,inj,Quad PF,6+ Mos 01/12/2018, 12/31/2018   Influenza-Unspecified 01/11/2017, 01/25/2021   PFIZER(Purple Top)SARS-COV-2 Vaccination 03/13/2020, 02/15/2021, 02/15/2021   Pneumococcal Conjugate-13 11/28/2013, 02/17/2021   Pneumococcal Polysaccharide-23 09/04/2019   Td 04/05/2007   Tdap 02/22/2017   Zoster Recombinant(Shingrix) 04/02/2020, 06/18/2020    Screening Tests Health Maintenance  Topic Date Due   Diabetic kidney evaluation - Urine ACR  Never done   COVID-19 Vaccine (4 - 2024-25 season) 12/04/2022   INFLUENZA VACCINE  11/03/2023   OPHTHALMOLOGY EXAM  11/24/2023   HEMOGLOBIN A1C  01/25/2024   Diabetic kidney evaluation - eGFR measurement  02/27/2024   FOOT EXAM  02/27/2024   Medicare Annual Wellness (AWV)  11/05/2024   DTaP/Tdap/Td (3 - Td or Tdap) 02/23/2027   Colonoscopy  02/15/2028   Pneumococcal Vaccine: 50+ Years  Completed   Hepatitis C Screening  Completed   Zoster Vaccines- Shingrix  Completed   Hepatitis B Vaccines  Aged Out   HPV VACCINES  Aged Out   Meningococcal B Vaccine  Aged Out    Health Maintenance  Health Maintenance Due  Topic Date Due   Diabetic kidney evaluation - Urine ACR  Never done   COVID-19 Vaccine (4 - 2024-25 season) 12/04/2022   INFLUENZA VACCINE  11/03/2023   Health Maintenance Items Addressed:   Additional Screening:  Vision Screening: Recommended annual ophthalmology exams for early detection of glaucoma and other disorders of the eye. Would you like a referral to an eye doctor? No    Dental Screening: Recommended  annual dental exams for proper oral hygiene  Community Resource Referral / Chronic Care Management: CRR required this visit?  No   CCM required this visit?  No   Plan:    I have personally reviewed and noted the following in the patient's chart:   Medical and social history Use of alcohol, tobacco or illicit drugs  Current medications and supplements including opioid prescriptions. Patient is not currently taking opioid prescriptions. Functional ability and status Nutritional status Physical activity Advanced directives List of other physicians Hospitalizations, surgeries, and ER visits in previous 12 months Vitals Screenings to include cognitive, depression, and falls Referrals and appointments  In addition, I have reviewed and discussed with patient certain preventive protocols, quality metrics, and best practice recommendations. A written personalized care plan for preventive services as well as general preventive health recommendations were provided to patient.   Rojelio LELON Blush, LPN   04/08/7972   After Visit Summary: (MyChart) Due to this being a telephonic visit, the after visit summary with patients personalized plan  was offered to patient via MyChart   Notes: Nothing significant to report at this time.

## 2023-11-06 NOTE — Patient Instructions (Addendum)
 [name] , Thank you for taking time out of your busy schedule to complete your Annual Wellness Visit with me. I enjoyed our conversation and look forward to speaking with you again next year. I, as well as your care team,  appreciate your ongoing commitment to your health goals. Please review the following plan we discussed and let me know if I can assist you in the future. Your Game plan/ To Do List    Referrals: If you haven't heard from the office you've been referred to, please reach out to them at the phone provided.   Follow up Visits: We will see or speak with you next year for your Next Medicare AWV with our clinical staff 11/11/24 @ 10:40a Have you seen your provider in the last 6 months (3 months if uncontrolled diabetes)? Yes 07/26/23  Clinician Recommendations:  Aim for 30 minutes of exercise or brisk walking, 6-8 glasses of water, and 5 servings of fruits and vegetables each day.       This is a list of the screenings recommended for you:  Health Maintenance  Topic Date Due   Yearly kidney health urinalysis for diabetes  Never done   COVID-19 Vaccine (4 - 2024-25 season) 12/04/2022   Flu Shot  11/03/2023   Eye exam for diabetics  11/24/2023   Hemoglobin A1C  01/25/2024   Yearly kidney function blood test for diabetes  02/27/2024   Complete foot exam   02/27/2024   Medicare Annual Wellness Visit  11/05/2024   DTaP/Tdap/Td vaccine (3 - Td or Tdap) 02/23/2027   Colon Cancer Screening  02/15/2028   Pneumococcal Vaccine for age over 49  Completed   Hepatitis C Screening  Completed   Zoster (Shingles) Vaccine  Completed   Hepatitis B Vaccine  Aged Out   HPV Vaccine  Aged Out   Meningitis B Vaccine  Aged Out    Advanced directives: (Copy Requested) Please bring a copy of your health care power of attorney and living will to the office to be added to your chart at your convenience. You can mail to [facility] [address] [place], [place] [address] or email  to [email or web address] Advance Care Planning is important because it:  [x]  Makes sure you receive the medical care that is consistent with your values, goals, and preferences  [x]  It provides guidance to your family and loved ones and reduces their decisional burden about whether or not they are making the right decisions based on your wishes.  Follow the link provided in your after visit summary or read over the paperwork we have mailed to you to help you started getting your Advance Directives in place. If you need assistance in completing these, please reach out to us  so that we can help you!  See attachments for Preventive Care and Fall Prevention Tips.

## 2023-11-09 ENCOUNTER — Other Ambulatory Visit: Payer: Self-pay | Admitting: Family Medicine

## 2023-11-10 ENCOUNTER — Encounter: Payer: Self-pay | Admitting: Family Medicine

## 2023-11-10 ENCOUNTER — Other Ambulatory Visit: Payer: Self-pay | Admitting: Family Medicine

## 2023-11-10 NOTE — Telephone Encounter (Signed)
 Copied from CRM [id number]. Topic: Clinical - Medication Refill >> Nov 10, 2023  2:40 PM Jayma L wrote: Medication: empagliflozin  (JARDIANCE ) 25 MG TABS tablet  Has the patient contacted their pharmacy? Yes (Agent: If no, request that the patient contact the pharmacy for the refill. If patient does not wish to contact the pharmacy document the reason why and proceed with request.) (Agent: If yes, when and what did the pharmacy advise?)  This is the patient's preferred pharmacy:  [facility] [address], [place] - [address] [place] [address] Phone: [phone] Fax: [phone]  Is this the correct pharmacy for this prescription? Yes If no, delete pharmacy and type the correct one.   Has the prescription been filled recently? No  Is the patient out of the medication? No  Has the patient been seen for an appointment in the last year OR does the patient have an upcoming appointment? Yes  Can we respond through MyChart? Yes  Agent: Please be advised that Rx refills may take up to 3 business days. We ask that you follow-up with your pharmacy.

## 2023-11-11 ENCOUNTER — Other Ambulatory Visit: Payer: Self-pay | Admitting: Family Medicine

## 2023-11-24 ENCOUNTER — Other Ambulatory Visit: Payer: Self-pay | Admitting: Family Medicine

## 2023-12-03 ENCOUNTER — Other Ambulatory Visit: Payer: Self-pay | Admitting: Family Medicine

## 2023-12-04 ENCOUNTER — Encounter: Payer: Self-pay | Admitting: Family Medicine

## 2023-12-05 MED ORDER — HYDROXYZINE HCL 25 MG PO TABS: ORAL_TABLET | ORAL | 0 refills | Status: DC

## 2023-12-08 ENCOUNTER — Other Ambulatory Visit: Payer: Self-pay | Admitting: Family Medicine

## 2023-12-12 ENCOUNTER — Ambulatory Visit: Admitting: Family Medicine

## 2023-12-12 ENCOUNTER — Encounter: Payer: Self-pay | Admitting: Family Medicine

## 2023-12-12 VITALS — BP 104/60 | HR 83 | Temp 98.0°F | Wt 305.1 lb

## 2023-12-12 DIAGNOSIS — E785 Hyperlipidemia, unspecified: Secondary | ICD-10-CM | POA: Diagnosis not present

## 2023-12-12 DIAGNOSIS — Z794 Long term (current) use of insulin: Secondary | ICD-10-CM | POA: Diagnosis not present

## 2023-12-12 DIAGNOSIS — E1165 Type 2 diabetes mellitus with hyperglycemia: Secondary | ICD-10-CM | POA: Diagnosis not present

## 2023-12-12 DIAGNOSIS — Z6841 Body Mass Index (BMI) 40.0 and over, adult: Secondary | ICD-10-CM

## 2023-12-12 DIAGNOSIS — I10 Essential (primary) hypertension: Secondary | ICD-10-CM

## 2023-12-12 LAB — POCT GLYCOSYLATED HEMOGLOBIN (HGB A1C): Hemoglobin A1C: 6.2 % — AB (ref 4.0–5.6)

## 2023-12-12 MED ORDER — TIRZEPATIDE 10 MG/0.5ML ~~LOC~~ SOAJ: 10.0000 mg | SUBCUTANEOUS | 3 refills | Status: DC

## 2023-12-12 MED ORDER — ROSUVASTATIN CALCIUM 10 MG PO TABS: 10.0000 mg | ORAL_TABLET | Freq: Every day | ORAL | 3 refills | Status: AC

## 2023-12-12 NOTE — Patient Instructions (Signed)
 Set up follow up in November in for  physical/labs  I sent in Rosuvastatin  to start for cholesterol  A1C today controlled at 6.2%.

## 2023-12-12 NOTE — Progress Notes (Signed)
 Established Patient Office Visit  Subjective   Patient ID: Tyler Andrews, male    DOB: Jan 05, 1956  Age: 68 y.o. MRN: [id number]  Chief Complaint  Patient presents with   Medical Management of Chronic Issues    HPI   Mr. Savoca has history of morbid obesity, hypertension, obstructive sleep apnea, type 2 diabetes, chronic anxiety, hyperlipidemia.  He has had history of severe recurrent dermatitis followed by dermatology and treated with Dupixent which is keeping things at bay.  Diabetes has been relatively stable.  He is currently on Mounjaro  7.5 mg once weekly and tolerating well.  Has not seen significant weight loss but sugars have been steadily improving.  A1c today 6.2% which is down from 6.4% previously.  He would like to consider further titration of Mounjaro  to hopefully lose some additional weight.  Goes to the gym 2 days/week.  Remains on insulin .  He has noticed a pattern that his blood sugars always seem to be higher in the mornings even before he eats.  They tend to be considerably better later in the day  He does have history of hyperlipidemia and had been on simvastatin .  He was taken off medications including statins to see if this may have had something to do with triggering his rash to no avail.  Is inquiring today about getting back on statin.  He plans to schedule physical in November and will get follow-up labs then.  Denies any prior history of intolerance with statins.  Past Medical History:  Diagnosis Date   Chicken pox    Depression    Diabetes mellitus without complication (HCC)    Hypertension    Urinary tract infection    Past Surgical History:  Procedure Laterality Date   COLON SURGERY  02/13/2018   Dr. Eben in [place]   KNEE SURGERY  10/2017   Right knee, tendon rupture    reports that he has never smoked. He has never used smokeless tobacco. He reports that he does not drink alcohol and does not use drugs. family history includes Cancer  in his maternal grandfather; Diabetes in his father; Heart disease (age of onset: 77) in his father. Allergies  Allergen Reactions   Trulicity  [Dulaglutide ] Nausea Only    Review of Systems  Constitutional:  Negative for fever and malaise/fatigue.  Eyes:  Negative for blurred vision.  Respiratory:  Negative for shortness of breath.   Cardiovascular:  Negative for chest pain.  Gastrointestinal:  Negative for abdominal pain and nausea.  Neurological:  Negative for dizziness, weakness and headaches.      Objective:     BP 104/60   Pulse 83   Temp 98 F (36.7 C) (Oral)   Wt (!) 305 lb 1.6 oz (138.4 kg)   SpO2 95%   BMI 49.24 kg/m  BP Readings from Last 3 Encounters:  12/12/23 104/60  07/26/23 126/60  06/09/23 (!) 140/68   Wt Readings from Last 3 Encounters:  12/12/23 (!) 305 lb 1.6 oz (138.4 kg)  11/06/23 (!) 306 lb (138.8 kg)  07/26/23 (!) 306 lb 1.6 oz (138.8 kg)      Physical Exam Vitals reviewed.  Constitutional:      General: He is not in acute distress.    Appearance: He is well-developed.  HENT:     Right Ear: External ear normal.     Left Ear: External ear normal.  Eyes:     Pupils: Pupils are equal, round, and reactive to light.  Neck:  Thyroid : No thyromegaly.  Cardiovascular:     Rate and Rhythm: Normal rate and regular rhythm.  Pulmonary:     Effort: Pulmonary effort is normal. No respiratory distress.     Breath sounds: Normal breath sounds. No wheezing or rales.  Musculoskeletal:     Cervical back: Neck supple.  Neurological:     Mental Status: He is alert and oriented to person, place, and time.      Results for orders placed or performed in visit on 12/12/23  POC HgB A1c  Result Value Ref Range   Hemoglobin A1C 6.2 (A) 4.0 - 5.6 %   HbA1c POC (<> result, manual entry)     HbA1c, POC (prediabetic range)     HbA1c, POC (controlled diabetic range)      Last CBC Lab Results  Component Value Date   WBC 9.2 02/27/2023   HGB 14.9  02/27/2023   HCT 45.7 02/27/2023   MCV 91.4 02/27/2023   RDW 15.6 (H) 02/27/2023   PLT 314.0 02/27/2023   Last metabolic panel Lab Results  Component Value Date   GLUCOSE 112 (H) 02/27/2023   NA 139 02/27/2023   K 4.3 02/27/2023   CL 103 02/27/2023   CO2 27 02/27/2023   BUN 15 02/27/2023   CREATININE 0.82 02/27/2023   GFR 90.22 02/27/2023   CALCIUM  9.2 02/27/2023   PROT 7.0 02/27/2023   ALBUMIN 4.0 02/27/2023   BILITOT 0.5 02/27/2023   ALKPHOS 74 02/27/2023   AST 12 02/27/2023   ALT 15 02/27/2023   Last lipids Lab Results  Component Value Date   CHOL 143 02/27/2023   HDL 45.20 02/27/2023   LDLCALC 69 02/27/2023   LDLDIRECT 74.4 05/24/2013   TRIG 145.0 02/27/2023   CHOLHDL 3 02/27/2023   Last hemoglobin A1c Lab Results  Component Value Date   HGBA1C 6.2 (A) 12/12/2023   Last thyroid  functions Lab Results  Component Value Date   TSH 1.94 02/21/2022      The 10-year ASCVD risk score (Arnett DK, et al., 2019) is: 22.2%    Assessment & Plan:   #1 type 2 diabetes controlled with A1c today 6.2%.  He still has early morning hyperglycemia but blood pressures tend to be much better controlled later in the day.  He does inquire about titrating Mounjaro  dose more for his obesity and we agreed to increasing this to 10 mg daily.  Set up follow-up in a couple months to reassess  #2 history of dyslipidemia.  Currently off statin.  Not clear if statin had anything to do with trigger his rash.  Had previously taken simvastatin .  Recommend initiating rosuvastatin  which would have fewer risk for drug interactions and simvastatin  and will start at 10 mg daily and recheck fasting lipid and hepatic at 34-month follow-up  #3 hypertension very well-controlled on lisinopril  30 mg daily.  #4 morbid obesity.  Titrating Mounjaro  to 10 mg subcutaneous once weekly and reassess in 2 months  Wolm Scarlet, MD

## 2023-12-25 DIAGNOSIS — D3131 Benign neoplasm of right choroid: Secondary | ICD-10-CM | POA: Diagnosis not present

## 2023-12-25 DIAGNOSIS — H2513 Age-related nuclear cataract, bilateral: Secondary | ICD-10-CM | POA: Diagnosis not present

## 2023-12-25 DIAGNOSIS — D3132 Benign neoplasm of left choroid: Secondary | ICD-10-CM | POA: Diagnosis not present

## 2023-12-25 DIAGNOSIS — E119 Type 2 diabetes mellitus without complications: Secondary | ICD-10-CM | POA: Diagnosis not present

## 2023-12-25 DIAGNOSIS — H35372 Puckering of macula, left eye: Secondary | ICD-10-CM | POA: Diagnosis not present

## 2023-12-25 DIAGNOSIS — H43813 Vitreous degeneration, bilateral: Secondary | ICD-10-CM | POA: Diagnosis not present

## 2023-12-31 ENCOUNTER — Encounter: Payer: Self-pay | Admitting: Family Medicine

## 2024-01-01 MED ORDER — HYDROXYZINE HCL 25 MG PO TABS: ORAL_TABLET | ORAL | 0 refills | Status: DC

## 2024-01-24 NOTE — Progress Notes (Signed)
 Tyler Andrews                                          MRN: [id number]   01/24/2024   The [facility] Quality Team Specialist reviewed this patient medical record for the purposes of chart review for care gap closure. The following were reviewed: abstraction for care gap closure-glycemic status assessment. Chart reviewed for KED as well. Appt 02/28/2024  [facility] Quality Team

## 2024-01-28 ENCOUNTER — Other Ambulatory Visit: Payer: Self-pay | Admitting: Family Medicine

## 2024-01-29 DIAGNOSIS — J351 Hypertrophy of tonsils: Secondary | ICD-10-CM | POA: Diagnosis not present

## 2024-01-29 DIAGNOSIS — J3489 Other specified disorders of nose and nasal sinuses: Secondary | ICD-10-CM | POA: Diagnosis not present

## 2024-01-29 DIAGNOSIS — K219 Gastro-esophageal reflux disease without esophagitis: Secondary | ICD-10-CM | POA: Diagnosis not present

## 2024-02-01 ENCOUNTER — Other Ambulatory Visit: Payer: Self-pay | Admitting: Family Medicine

## 2024-02-04 ENCOUNTER — Encounter: Payer: Self-pay | Admitting: Family Medicine

## 2024-02-05 MED ORDER — HYDROXYZINE HCL 25 MG PO TABS: ORAL_TABLET | ORAL | 0 refills | Status: DC

## 2024-02-07 ENCOUNTER — Other Ambulatory Visit: Payer: Self-pay | Admitting: Family Medicine

## 2024-02-25 ENCOUNTER — Other Ambulatory Visit: Payer: Self-pay | Admitting: Family Medicine

## 2024-02-28 ENCOUNTER — Ambulatory Visit (INDEPENDENT_AMBULATORY_CARE_PROVIDER_SITE_OTHER): Admitting: Family Medicine

## 2024-02-28 ENCOUNTER — Encounter: Payer: Self-pay | Admitting: Family Medicine

## 2024-02-28 VITALS — BP 132/74 | HR 74 | Temp 97.8°F | Ht 66.0 in | Wt 303.0 lb

## 2024-02-28 DIAGNOSIS — E1165 Type 2 diabetes mellitus with hyperglycemia: Secondary | ICD-10-CM | POA: Diagnosis not present

## 2024-02-28 DIAGNOSIS — Z0000 Encounter for general adult medical examination without abnormal findings: Secondary | ICD-10-CM | POA: Diagnosis not present

## 2024-02-28 DIAGNOSIS — E785 Hyperlipidemia, unspecified: Secondary | ICD-10-CM

## 2024-02-28 DIAGNOSIS — R21 Rash and other nonspecific skin eruption: Secondary | ICD-10-CM

## 2024-02-28 DIAGNOSIS — Z794 Long term (current) use of insulin: Secondary | ICD-10-CM

## 2024-02-28 LAB — CBC WITH DIFFERENTIAL/PLATELET
Basophils Absolute: 0 K/uL (ref 0.0–0.1)
Basophils Relative: 0.6 % (ref 0.0–3.0)
Eosinophils Absolute: 0.3 K/uL (ref 0.0–0.7)
Eosinophils Relative: 3.2 % (ref 0.0–5.0)
HCT: 46.1 % (ref 39.0–52.0)
Hemoglobin: 15 g/dL (ref 13.0–17.0)
Lymphocytes Relative: 25.8 % (ref 12.0–46.0)
Lymphs Abs: 2.2 K/uL (ref 0.7–4.0)
MCHC: 32.5 g/dL (ref 30.0–36.0)
MCV: 88.7 fl (ref 78.0–100.0)
Monocytes Absolute: 0.8 K/uL (ref 0.1–1.0)
Monocytes Relative: 9.2 % (ref 3.0–12.0)
Neutro Abs: 5.3 K/uL (ref 1.4–7.7)
Neutrophils Relative %: 61.2 % (ref 43.0–77.0)
Platelets: 295 K/uL (ref 150.0–400.0)
RBC: 5.2 Mil/uL (ref 4.22–5.81)
RDW: 15 % (ref 11.5–15.5)
WBC: 8.7 K/uL (ref 4.0–10.5)

## 2024-02-28 LAB — BASIC METABOLIC PANEL WITH GFR
BUN: 19 mg/dL (ref 6–23)
CO2: 27 meq/L (ref 19–32)
Calcium: 9.5 mg/dL (ref 8.4–10.5)
Chloride: 103 meq/L (ref 96–112)
Creatinine, Ser: 0.82 mg/dL (ref 0.40–1.50)
GFR: 89.59 mL/min (ref >60.00)
Glucose, Bld: 155 mg/dL — ABNORMAL HIGH (ref 70–99)
Potassium: 5.2 meq/L — ABNORMAL HIGH (ref 3.5–5.1)
Sodium: 137 meq/L (ref 135–145)

## 2024-02-28 LAB — LIPID PANEL
Cholesterol: 127 mg/dL (ref 0–200)
HDL: 43.5 mg/dL (ref >39.00)
LDL Cholesterol: 50 mg/dL (ref 0–99)
NonHDL: 83.52
Total CHOL/HDL Ratio: 3
Triglycerides: 169 mg/dL — ABNORMAL HIGH (ref 0.0–149.0)
VLDL: 33.8 mg/dL (ref 0.0–40.0)

## 2024-02-28 LAB — MICROALBUMIN / CREATININE URINE RATIO
Creatinine,U: 34.3 mg/dL
Microalb Creat Ratio: 42.4 mg/g — ABNORMAL HIGH (ref 0.0–30.0)
Microalb, Ur: 1.5 mg/dL (ref 0.0–1.9)

## 2024-02-28 LAB — HEPATIC FUNCTION PANEL
ALT: 14 U/L (ref 0–53)
AST: 12 U/L (ref 0–37)
Albumin: 4.2 g/dL (ref 3.5–5.2)
Alkaline Phosphatase: 72 U/L (ref 39–117)
Bilirubin, Direct: 0.1 mg/dL (ref 0.0–0.3)
Total Bilirubin: 0.5 mg/dL (ref 0.2–1.2)
Total Protein: 7.2 g/dL (ref 6.0–8.3)

## 2024-02-28 LAB — HEMOGLOBIN A1C: Hgb A1c MFr Bld: 6.5 % (ref 4.6–6.5)

## 2024-02-28 LAB — PSA, MEDICARE: PSA: 0.61 ng/mL (ref 0.10–4.00)

## 2024-02-28 MED ORDER — HYDROXYZINE HCL 25 MG PO TABS: ORAL_TABLET | ORAL | 0 refills | Status: DC

## 2024-02-28 MED ORDER — TIRZEPATIDE 12.5 MG/0.5ML ~~LOC~~ SOAJ: 12.5000 mg | SUBCUTANEOUS | 3 refills | Status: AC

## 2024-02-28 NOTE — Progress Notes (Signed)
 Established Patient Office Visit  Subjective   Patient ID: Tyler Andrews, male    DOB: 11/18/1955  Age: 68 y.o. MRN: [id number]  Chief Complaint  Patient presents with   Annual Exam    HPI   Tyler Andrews is seen today for complete physical.  His chronic problems include history of hypertension, morbid obesity, obstructive sleep apnea, type 2 diabetes, chronic insomnia, hyperlipidemia.  Generally doing well.  He still exercises at the gym 3 days/week.  His weight has been stable.  He has struggled to lose in spite of Mounjaro  10 mg once weekly.  Does not do any strict calorie counting.  He continues to struggle with sleep issues.  Still takes Atarax  at night.  He has struggled with severe eczema for quite some time.  Still sees dermatologist and is treated with Dupixent which has helped but not completely abolish the rash.  Health maintenance reviewed:  Health Maintenance  Topic Date Due   Diabetic kidney evaluation - Urine ACR  Never done   COVID-19 Vaccine (4 - 2025-26 season) 12/04/2023   Diabetic kidney evaluation - eGFR measurement  02/27/2024   HEMOGLOBIN A1C  06/10/2024   Medicare Annual Wellness (AWV)  11/05/2024   OPHTHALMOLOGY EXAM  11/14/2024   FOOT EXAM  02/27/2025   DTaP/Tdap/Td (3 - Td or Tdap) 02/23/2027   Colonoscopy  02/15/2028   Pneumococcal Vaccine: 50+ Years  Completed   Influenza Vaccine  Completed   Hepatitis C Screening  Completed   Zoster Vaccines- Shingrix  Completed   Meningococcal B Vaccine  Aged Out   - No history of RSV vaccine. -Other vaccines up-to-date. -There is some question whether he is due for 5-year repeat colonoscopy versus 10 and he will check with his GI provider in [facility] for that  Family history-mother died age 80 ALS.  He has a sister died motor vehicle accident.  Father had history of CAD.  1 sister who is healthy and the other end-stage renal disease presumably secondary to diabetes complications  Social history-retired  couple years ago.  Non-smoker.  No alcohol.  Past Medical History:  Diagnosis Date   Chicken pox    Depression    Diabetes mellitus without complication (HCC)    Hypertension    Urinary tract infection    Past Surgical History:  Procedure Laterality Date   COLON SURGERY  02/13/2018   Dr. Eben in [place]   KNEE SURGERY  10/2017   Right knee, tendon rupture    reports that he has never smoked. He has never used smokeless tobacco. He reports that he does not drink alcohol and does not use drugs. family history includes Cancer in his maternal grandfather; Diabetes in his father; Heart disease (age of onset: 55) in his father. Allergies  Allergen Reactions   Trulicity  [Dulaglutide ] Nausea Only    Review of Systems  Constitutional:  Negative for chills, fever, malaise/fatigue and weight loss.  HENT:  Negative for hearing loss.   Eyes:  Negative for blurred vision and double vision.  Respiratory:  Negative for cough and shortness of breath.   Cardiovascular:  Negative for chest pain, palpitations and leg swelling.  Gastrointestinal:  Negative for abdominal pain, blood in stool, constipation and diarrhea.  Genitourinary:  Negative for dysuria.  Neurological:  Negative for dizziness, speech change, seizures, loss of consciousness and headaches.  Psychiatric/Behavioral:  Negative for depression.       Objective:     BP 132/74   Pulse 74  Temp 97.8 F (36.6 C) (Oral)   Ht 5' 6 (1.676 m)   Wt (!) 303 lb (137.4 kg)   SpO2 97%   BMI 48.91 kg/m  BP Readings from Last 3 Encounters:  02/28/24 132/74  12/12/23 104/60  07/26/23 126/60   Wt Readings from Last 3 Encounters:  02/28/24 (!) 303 lb (137.4 kg)  12/12/23 (!) 305 lb 1.6 oz (138.4 kg)  11/06/23 (!) 306 lb (138.8 kg)      Physical Exam Constitutional:      General: He is not in acute distress.    Appearance: He is well-developed.  HENT:     Head: Normocephalic and atraumatic.     Ears:     Comments:  Minimal cerumen in canals Eyes:     Conjunctiva/sclera: Conjunctivae normal.     Pupils: Pupils are equal, round, and reactive to light.  Neck:     Thyroid : No thyromegaly.  Cardiovascular:     Rate and Rhythm: Normal rate and regular rhythm.     Heart sounds: Normal heart sounds. No murmur heard. Pulmonary:     Effort: No respiratory distress.     Breath sounds: No wheezing or rales.  Abdominal:     General: Bowel sounds are normal. There is no distension.     Palpations: Abdomen is soft. There is no mass.     Tenderness: There is no abdominal tenderness. There is no guarding or rebound.  Musculoskeletal:     Cervical back: Normal range of motion and neck supple.  Lymphadenopathy:     Cervical: No cervical adenopathy.  Skin:    Findings: No rash.     Comments: Feet reveal no skin lesions. Good distal foot pulses. Good capillary refill. No calluses. Normal sensation with monofilament testing   Neurological:     Mental Status: He is alert and oriented to person, place, and time.     Cranial Nerves: No cranial nerve deficit.     Deep Tendon Reflexes: Reflexes normal.      No results found for any visits on 02/28/24.    The 10-year ASCVD risk score (Arnett DK, et al., 2019) is: 31.9%    Assessment & Plan:   Problem List Items Addressed This Visit       Unprioritized   Type 2 diabetes mellitus with hyperglycemia (HCC)   Relevant Orders   Microalbumin / creatinine urine ratio   Hemoglobin A1c   Severe obesity (BMI >= 40) (HCC)   Hyperlipidemia   Relevant Orders   Lipid panel   Hepatic function panel   Other Visit Diagnoses       Skin rash    -  Primary   Relevant Medications   hydrOXYzine  (ATARAX ) 25 MG tablet     Physical exam       Relevant Orders   Basic metabolic panel with GFR   CBC with Differential/Platelet   PSA, Medicare     Tyler Andrews is here today for physical exam.  He has morbid obesity and has struggled to lose weight even with Mounjaro  10 mg  subcutaneous once weekly.  Even though his A1c has been well-controlled we have recommended further titrating Mounjaro  to 12.5 mg subcutaneous once weekly.  We also suggested more strict calorie counting and consider free app to help track and monitor this.  We did also discuss other possible options such as Lap-Band/gastric bypass which she will consider.  -Recommend he consider RSV vaccine with history of diabetes. - Due for urine microalbumin in  addition to other labs as above - Other vaccines up-to-date - Patient will clarify with his GI provider timing for next recommended colonoscopy.  Return in about 3 months (around 05/30/2024).    Wolm Scarlet, MD

## 2024-03-02 ENCOUNTER — Ambulatory Visit: Payer: Self-pay | Admitting: Family Medicine

## 2024-03-04 ENCOUNTER — Encounter: Payer: Self-pay | Admitting: Family Medicine

## 2024-03-06 MED ORDER — LISINOPRIL 40 MG PO TABS: 40.0000 mg | ORAL_TABLET | Freq: Every day | ORAL | 3 refills | Status: AC

## 2024-03-08 ENCOUNTER — Other Ambulatory Visit: Payer: Self-pay | Admitting: Family Medicine

## 2024-03-22 ENCOUNTER — Encounter: Payer: Self-pay | Admitting: Family Medicine

## 2024-03-22 DIAGNOSIS — R21 Rash and other nonspecific skin eruption: Secondary | ICD-10-CM

## 2024-03-25 MED ORDER — HYDROXYZINE HCL 25 MG PO TABS: ORAL_TABLET | ORAL | 0 refills | Status: AC

## 2024-04-13 ENCOUNTER — Other Ambulatory Visit: Payer: Self-pay | Admitting: Family Medicine

## 2024-04-21 ENCOUNTER — Encounter: Payer: Self-pay | Admitting: Family Medicine

## 2024-05-05 ENCOUNTER — Other Ambulatory Visit: Payer: Self-pay | Admitting: Family Medicine

## 2024-11-11 ENCOUNTER — Ambulatory Visit
